# Patient Record
Sex: Male | Born: 1955
Health system: Southern US, Community
[De-identification: ages and names within clinical notes are randomized; demographics above are authoritative.]

## PROBLEM LIST (undated history)

## (undated) DIAGNOSIS — R739 Hyperglycemia, unspecified: Secondary | ICD-10-CM

## (undated) DIAGNOSIS — I2510 Atherosclerotic heart disease of native coronary artery without angina pectoris: Secondary | ICD-10-CM

## (undated) HISTORY — PX: HEMORRHOID SURGERY: SHX153

## (undated) HISTORY — DX: Hyperglycemia, unspecified: R73.9

## (undated) HISTORY — DX: Atherosclerotic heart disease of native coronary artery without angina pectoris: I25.10

---

## 2000-11-07 HISTORY — PX: HEMORRHOID SURGERY: SHX153

## 2000-11-09 ENCOUNTER — Encounter (INDEPENDENT_AMBULATORY_CARE_PROVIDER_SITE_OTHER): Payer: Self-pay | Admitting: *Deleted

## 2000-11-09 ENCOUNTER — Ambulatory Visit (HOSPITAL_BASED_OUTPATIENT_CLINIC_OR_DEPARTMENT_OTHER): Admission: RE | Admit: 2000-11-09 | Discharge: 2000-11-09 | Payer: Self-pay | Admitting: Surgery

## 2003-02-24 ENCOUNTER — Encounter: Payer: Self-pay | Admitting: Gastroenterology

## 2003-02-24 ENCOUNTER — Encounter: Admission: RE | Admit: 2003-02-24 | Discharge: 2003-02-24 | Payer: Self-pay | Admitting: Gastroenterology

## 2003-06-30 ENCOUNTER — Ambulatory Visit (HOSPITAL_COMMUNITY): Admission: RE | Admit: 2003-06-30 | Discharge: 2003-06-30 | Payer: Self-pay | Admitting: Gastroenterology

## 2003-06-30 ENCOUNTER — Encounter (INDEPENDENT_AMBULATORY_CARE_PROVIDER_SITE_OTHER): Payer: Self-pay | Admitting: *Deleted

## 2007-10-30 ENCOUNTER — Ambulatory Visit (HOSPITAL_COMMUNITY): Admission: RE | Admit: 2007-10-30 | Discharge: 2007-10-30 | Payer: Self-pay | Admitting: General Surgery

## 2007-10-30 ENCOUNTER — Encounter (INDEPENDENT_AMBULATORY_CARE_PROVIDER_SITE_OTHER): Payer: Self-pay | Admitting: General Surgery

## 2008-11-07 DIAGNOSIS — I2510 Atherosclerotic heart disease of native coronary artery without angina pectoris: Secondary | ICD-10-CM

## 2008-11-07 HISTORY — DX: Atherosclerotic heart disease of native coronary artery without angina pectoris: I25.10

## 2008-11-07 HISTORY — PX: CORONARY ARTERY BYPASS GRAFT: SHX141

## 2009-10-06 ENCOUNTER — Ambulatory Visit: Payer: Self-pay | Admitting: Thoracic Surgery (Cardiothoracic Vascular Surgery)

## 2009-10-06 ENCOUNTER — Inpatient Hospital Stay (HOSPITAL_COMMUNITY): Admission: AD | Admit: 2009-10-06 | Discharge: 2009-10-11 | Payer: Self-pay | Admitting: Cardiovascular Disease

## 2009-10-07 ENCOUNTER — Encounter: Payer: Self-pay | Admitting: Thoracic Surgery (Cardiothoracic Vascular Surgery)

## 2009-10-23 ENCOUNTER — Ambulatory Visit: Payer: Self-pay | Admitting: Thoracic Surgery (Cardiothoracic Vascular Surgery)

## 2009-11-02 ENCOUNTER — Ambulatory Visit: Payer: Self-pay | Admitting: Thoracic Surgery (Cardiothoracic Vascular Surgery)

## 2009-11-02 ENCOUNTER — Encounter
Admission: RE | Admit: 2009-11-02 | Discharge: 2009-11-02 | Payer: Self-pay | Admitting: Thoracic Surgery (Cardiothoracic Vascular Surgery)

## 2010-01-04 ENCOUNTER — Encounter
Admission: RE | Admit: 2010-01-04 | Discharge: 2010-01-04 | Payer: Self-pay | Admitting: Thoracic Surgery (Cardiothoracic Vascular Surgery)

## 2010-01-04 ENCOUNTER — Ambulatory Visit: Payer: Self-pay | Admitting: Thoracic Surgery (Cardiothoracic Vascular Surgery)

## 2010-07-23 ENCOUNTER — Ambulatory Visit: Payer: Self-pay | Admitting: Cardiovascular Disease

## 2010-11-16 IMAGING — CR DG CHEST 1V PORT
1 series · 1 of 1 positions shown · non-contrast
Comparison: 10/07/2009

CLINICAL DATA: Coronary artery disease.  Postop from coronary
bypass grafting.

PORTABLE CHEST - 1 VIEW

[view not recorded]
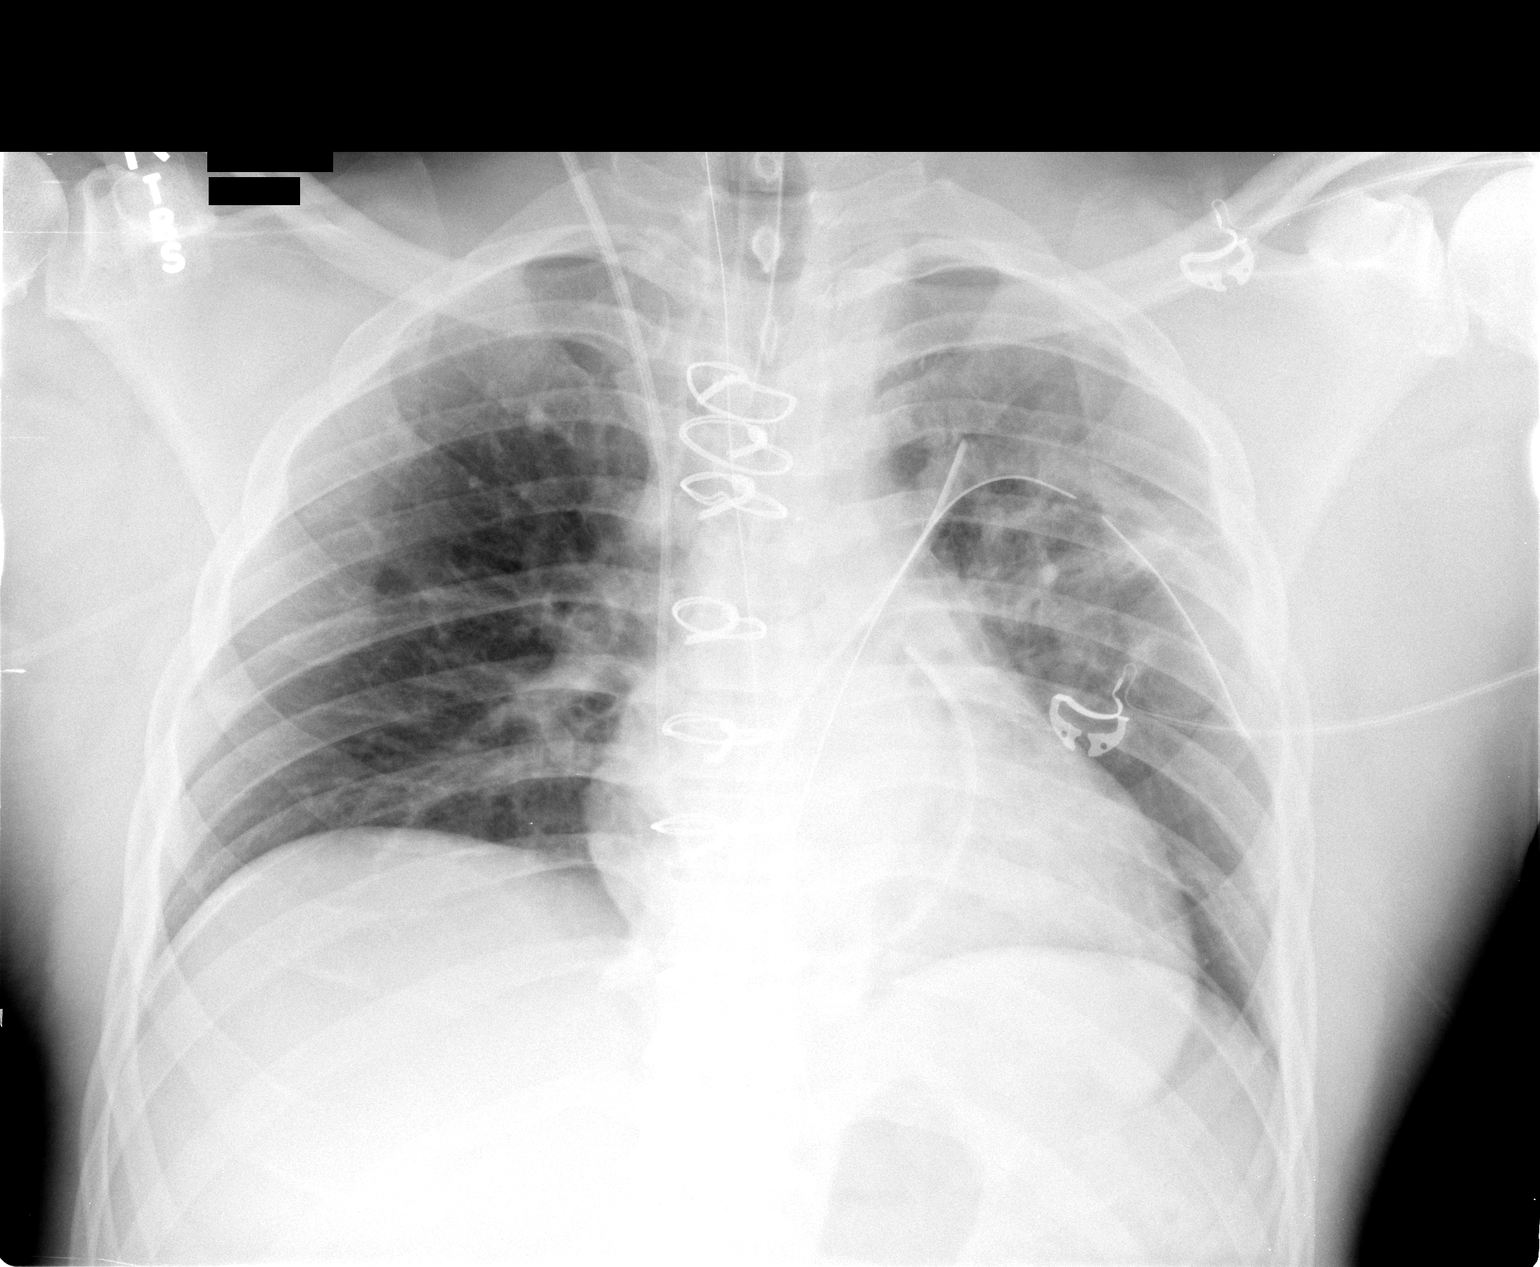

[1 of 1 positions shown; findings below may reference images not displayed]

FINDINGS: Swan-Ganz catheter tip is in the proximal right pulmonary
artery.  Endotracheal tube, nasogastric tube, mediastinal drain and
left chest tube are in appropriate position.

There is no evidence of pneumothorax or pleural effusion.  Mild
atelectasis is seen in the left upper lobe.  Heart size is within
normal limits.  Low lung volumes noted.
IMPRESSION: Postop chest.  Mild left upper lobe atelectasis.  No evidence of
pneumothorax.

## 2010-11-18 IMAGING — CR DG CHEST 2V
2 series · 2 of 2 positions shown · non-contrast
Comparison: Portable chest x-ray of 10/09/2009

CLINICAL DATA: Chest pain

CHEST - 2 VIEW

[w chest pa]
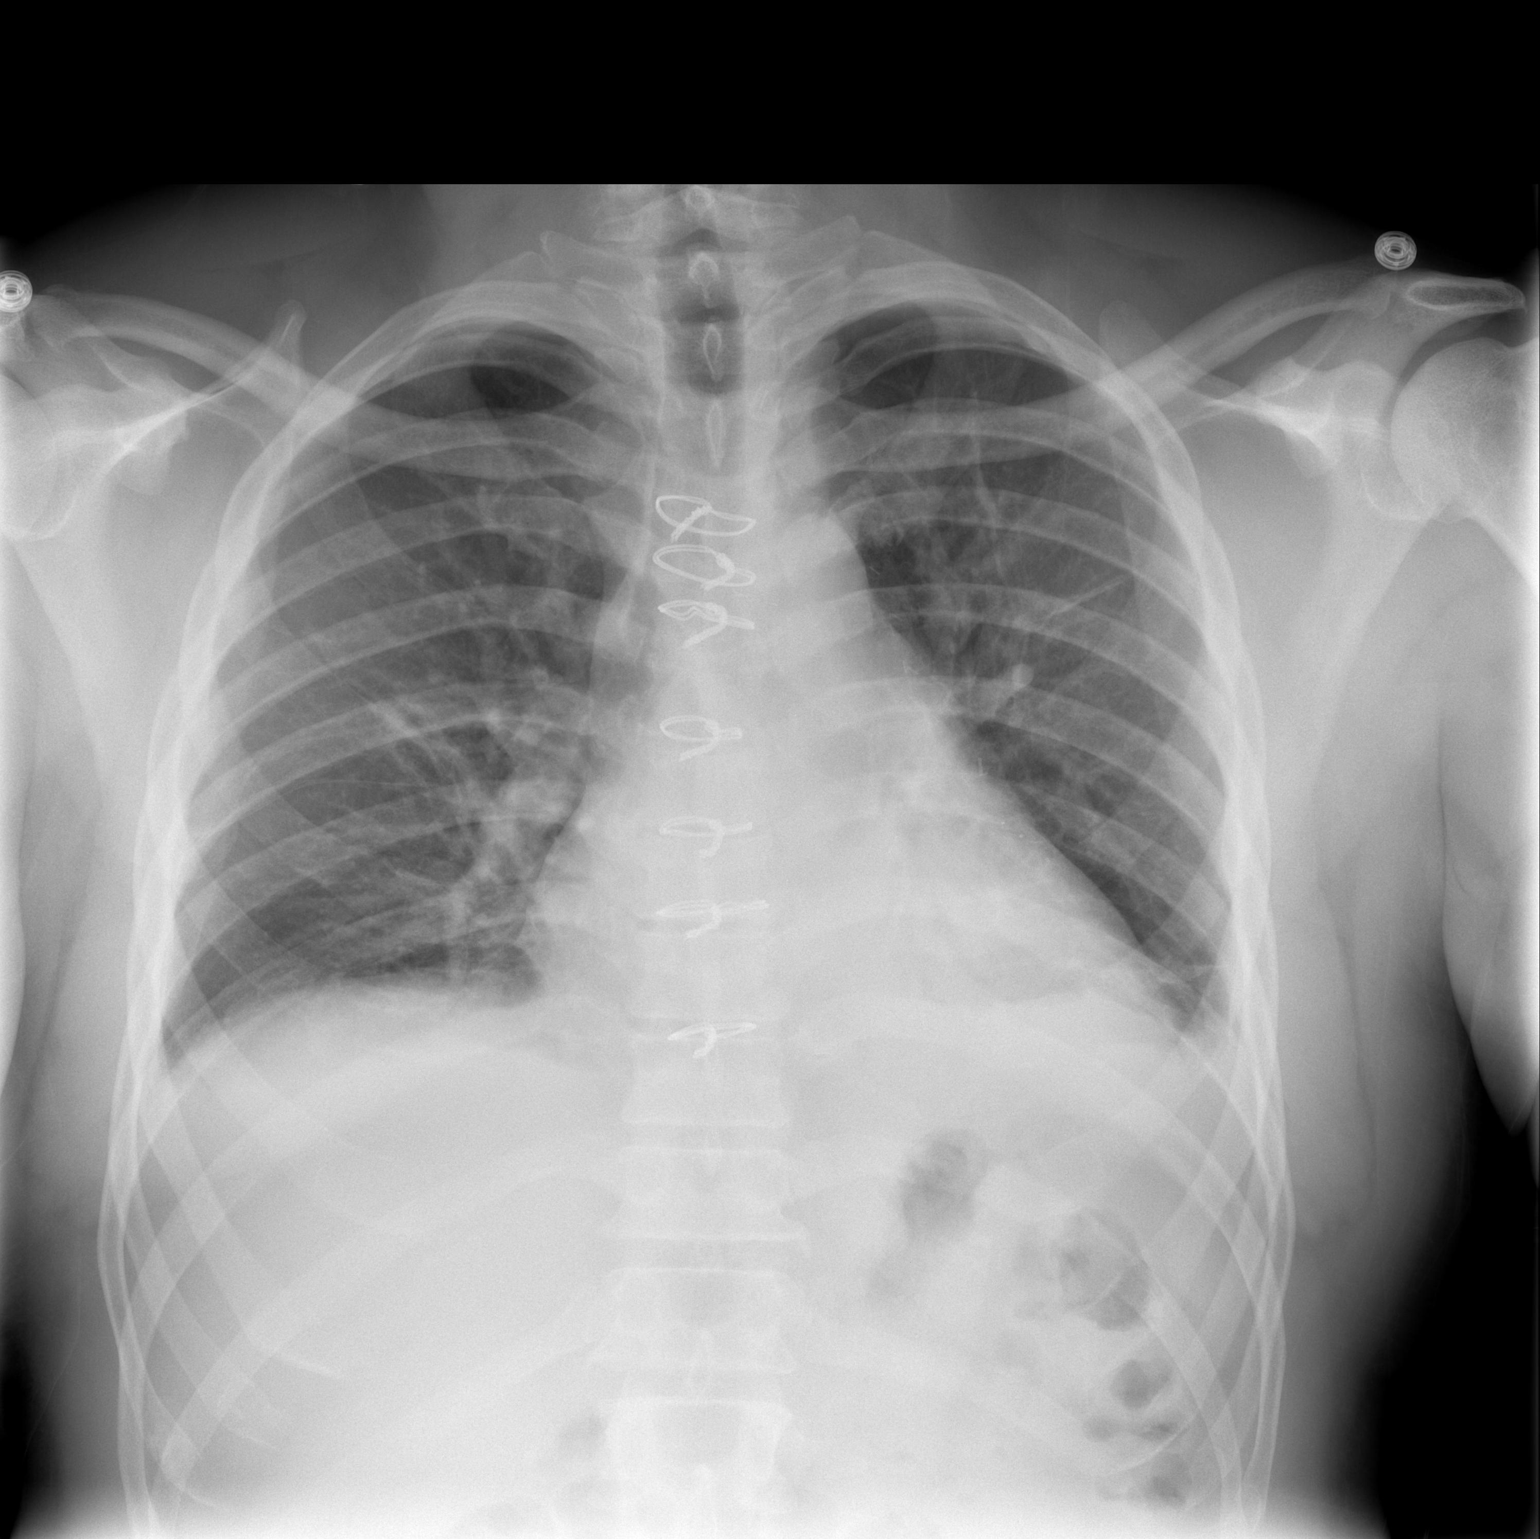

[w chest lat]
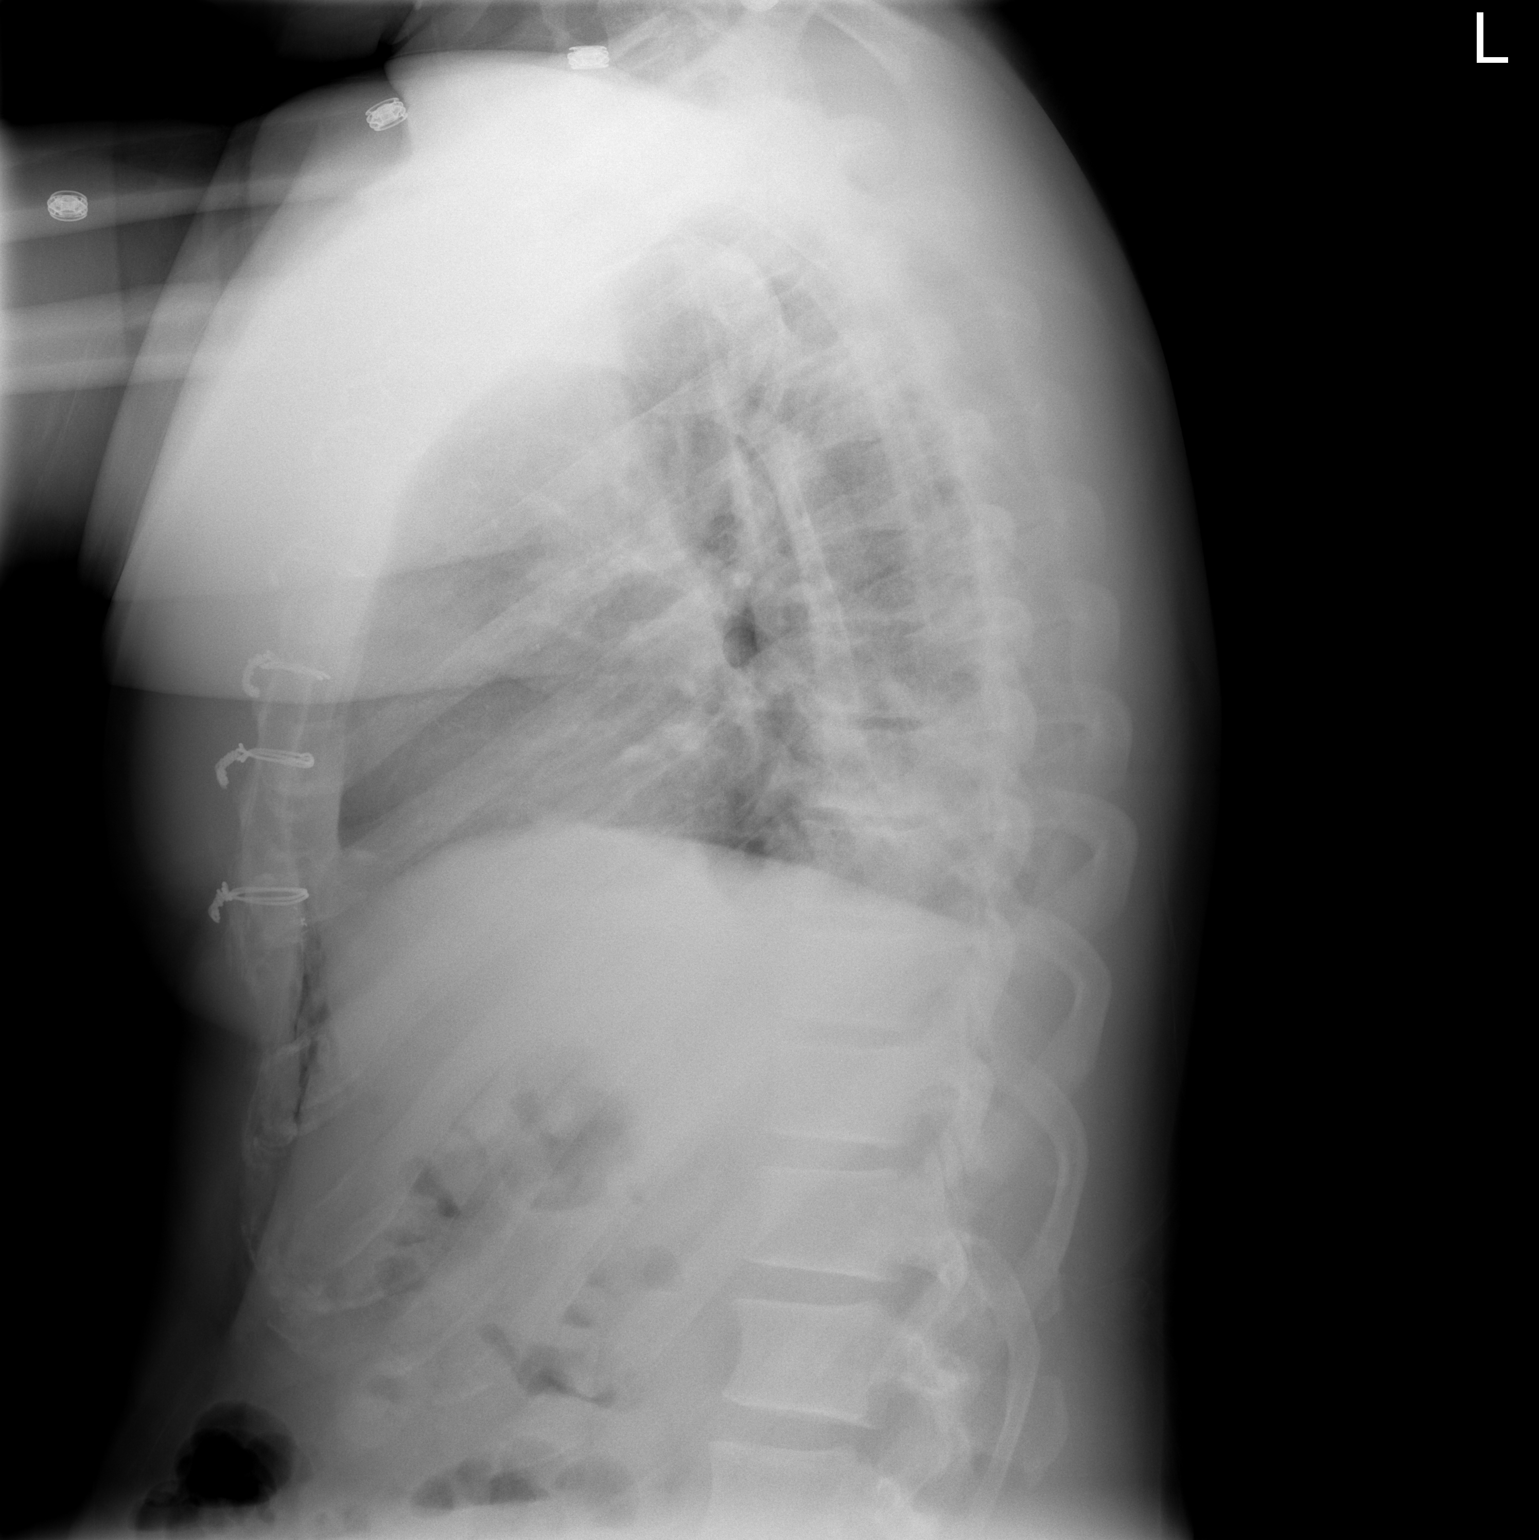

[2 of 2 positions shown; findings below may reference images not displayed]

FINDINGS: The left chest tube and the Swan-Ganz catheter have been
removed.  No pneumothorax is seen.  Basilar opacities persist left
greater than right most consistent with atelectasis and possible
small left effusion.
IMPRESSION: Swan-Ganz catheter and left chest tube removed.  No pneumothorax.

## 2010-12-11 IMAGING — CR DG CHEST 2V
2 series · 2 of 2 positions shown · non-contrast
Comparison: Chest x-ray of 10/10/2009

CLINICAL DATA: Open heart surgery 10/05/2009, follow-up

CHEST - 2 VIEW

[w chest pa]
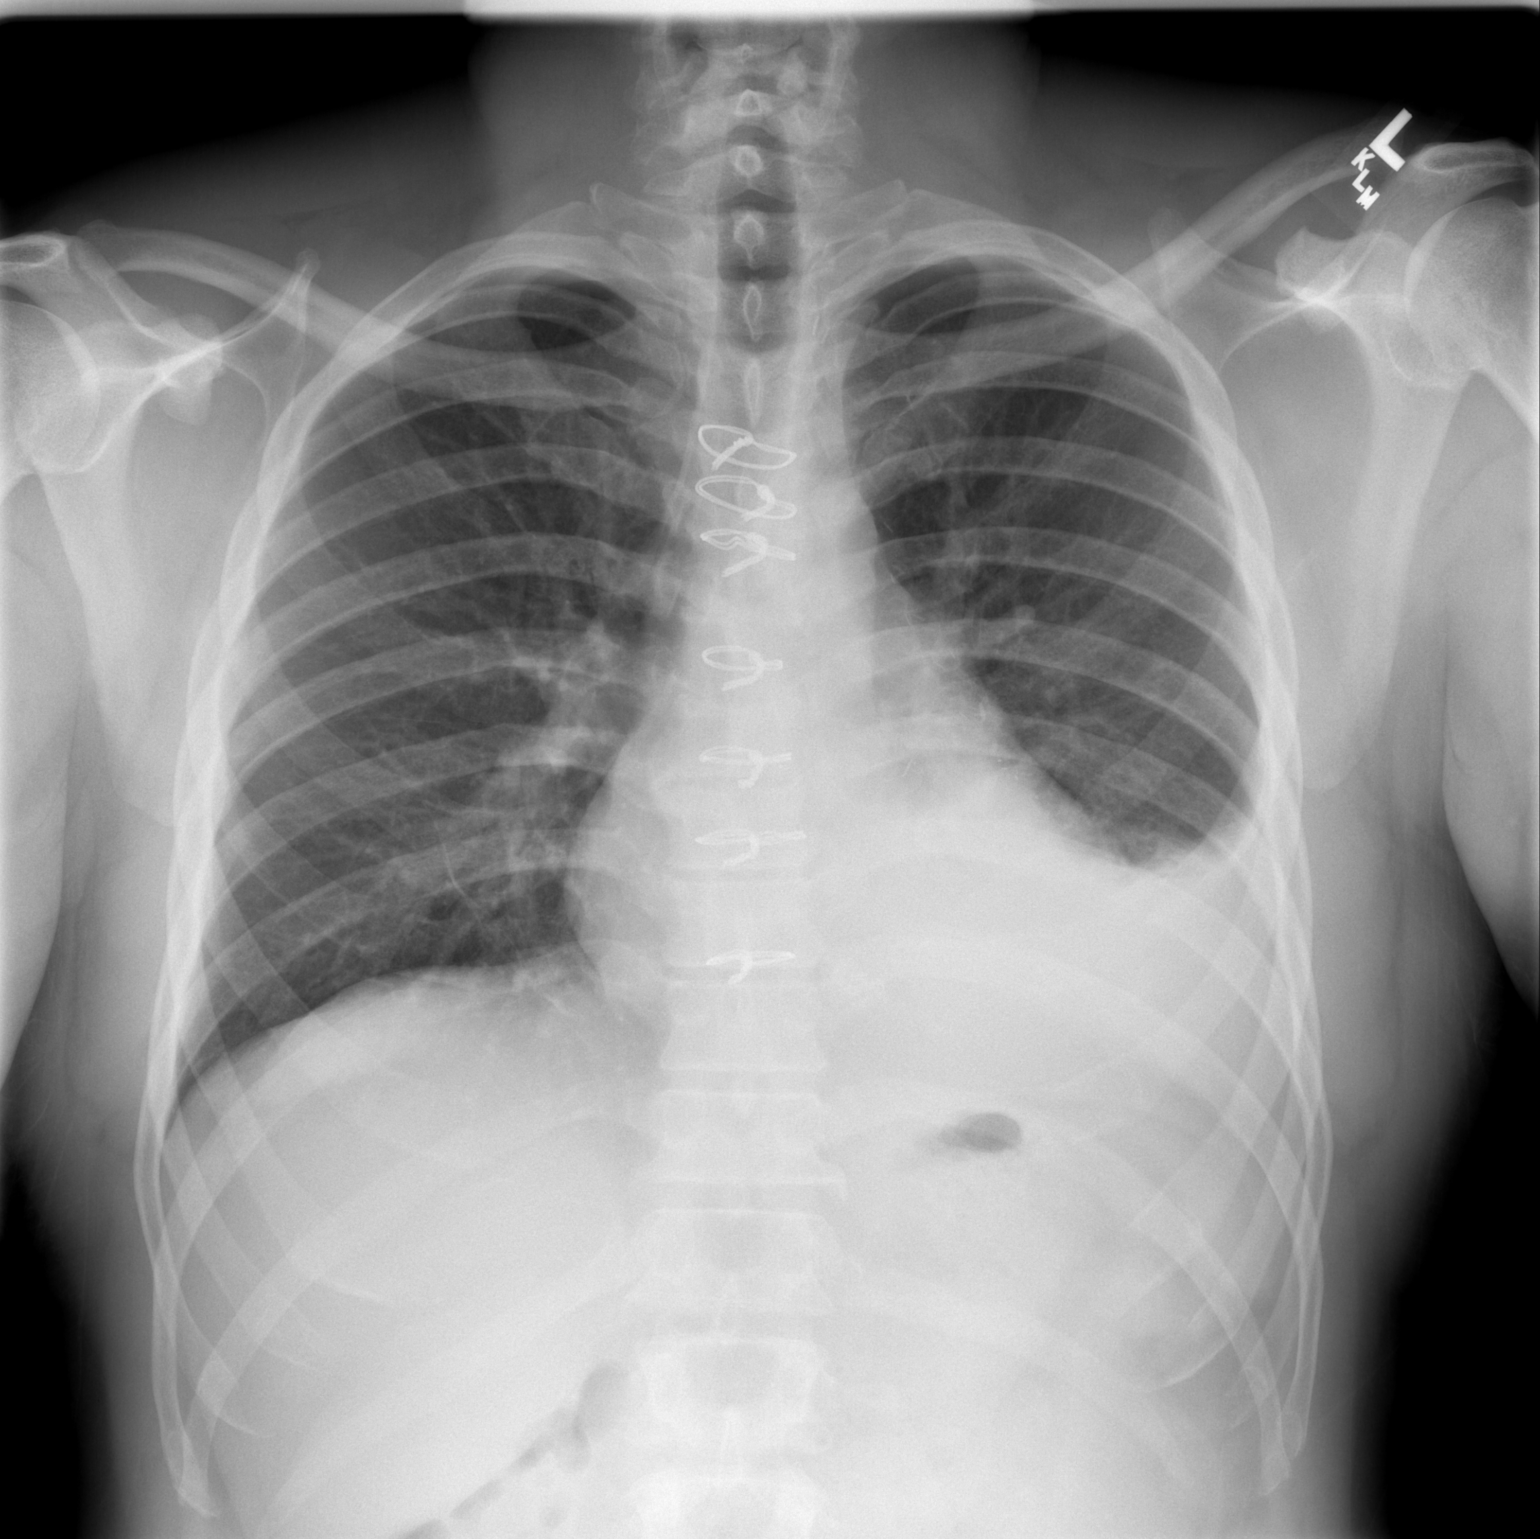

[w chest lat]
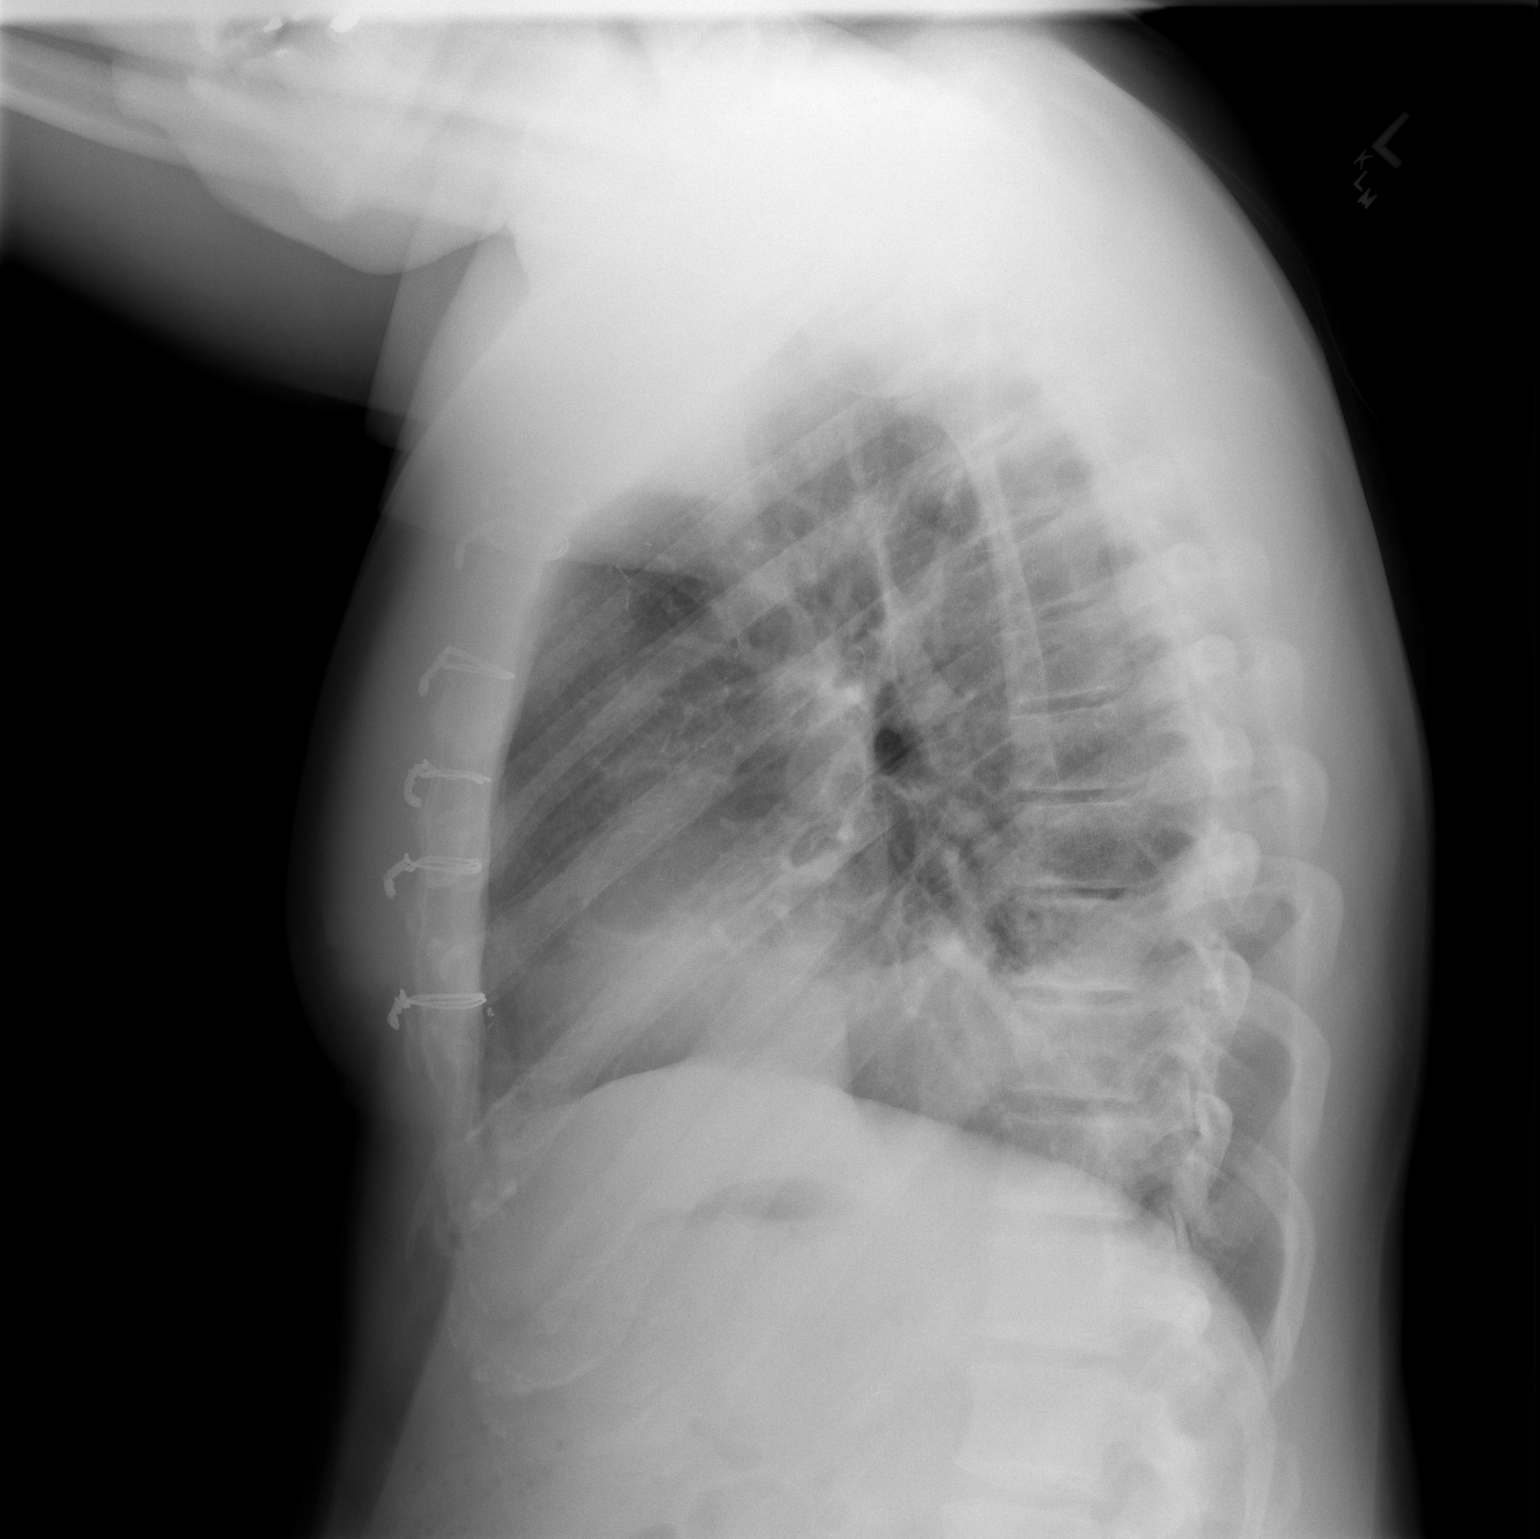

[2 of 2 positions shown; findings below may reference images not displayed]

FINDINGS: There has been an increase in opacity at the left lung
base in the interval.  This most likely is due to increasing
effusion as well as atelectasis.  Pneumonia involving the left
lower lobe cannot be excluded.  The right lung is clear.  Heart
size is stable.  Median sternotomy sutures are noted.
IMPRESSION: Increase in opacity at the left lung base consistent with increase
in left effusion, atelectasis. Left lower lobe pneumonia cannot be
excluded.

## 2010-12-28 ENCOUNTER — Encounter: Payer: Self-pay | Admitting: Cardiovascular Disease

## 2011-01-06 ENCOUNTER — Encounter: Payer: Self-pay | Admitting: Cardiovascular Disease

## 2011-01-06 DIAGNOSIS — I25708 Atherosclerosis of coronary artery bypass graft(s), unspecified, with other forms of angina pectoris: Secondary | ICD-10-CM | POA: Insufficient documentation

## 2011-01-06 DIAGNOSIS — E1169 Type 2 diabetes mellitus with other specified complication: Secondary | ICD-10-CM | POA: Insufficient documentation

## 2011-01-06 DIAGNOSIS — I2510 Atherosclerotic heart disease of native coronary artery without angina pectoris: Secondary | ICD-10-CM | POA: Insufficient documentation

## 2011-02-03 ENCOUNTER — Ambulatory Visit: Payer: Self-pay | Admitting: Cardiovascular Disease

## 2011-02-08 LAB — BASIC METABOLIC PANEL
BUN: 9 mg/dL (ref 6–23)
CO2: 25 mEq/L (ref 19–32)
CO2: 28 mEq/L (ref 19–32)
Calcium: 8.3 mg/dL — ABNORMAL LOW (ref 8.4–10.5)
Chloride: 107 mEq/L (ref 96–112)
Creatinine, Ser: 1.08 mg/dL (ref 0.4–1.5)
GFR calc Af Amer: >60 mL/min (ref >60)
GFR calc non Af Amer: >60 mL/min (ref >60)
Glucose, Bld: 116 mg/dL — ABNORMAL HIGH (ref 70–99)
Glucose, Bld: 151 mg/dL — ABNORMAL HIGH (ref 70–99)
Potassium: 3.9 mEq/L (ref 3.5–5.1)
Potassium: 4 mEq/L (ref 3.5–5.1)
Sodium: 138 mEq/L (ref 135–145)
Sodium: 141 mEq/L (ref 135–145)

## 2011-02-08 LAB — POCT I-STAT 4, (NA,K, GLUC, HGB,HCT)
Glucose, Bld: 127 mg/dL — ABNORMAL HIGH (ref 70–99)
HCT: 42 % (ref 39.0–52.0)
Hemoglobin: 11.9 g/dL — ABNORMAL LOW (ref 13.0–17.0)
Hemoglobin: 13.6 g/dL (ref 13.0–17.0)
Hemoglobin: 14.3 g/dL (ref 13.0–17.0)
Potassium: 3.6 mEq/L (ref 3.5–5.1)
Potassium: 3.9 mEq/L (ref 3.5–5.1)
Potassium: 3.9 mEq/L (ref 3.5–5.1)
Sodium: 141 mEq/L (ref 135–145)
Sodium: 143 mEq/L (ref 135–145)

## 2011-02-08 LAB — COMPREHENSIVE METABOLIC PANEL
ALT: 38 U/L (ref 0–53)
AST: 34 U/L (ref 0–37)
Albumin: 3.8 g/dL (ref 3.5–5.2)
Alkaline Phosphatase: 37 U/L — ABNORMAL LOW (ref 39–117)
CO2: 25 mEq/L (ref 19–32)
Chloride: 107 mEq/L (ref 96–112)
GFR calc non Af Amer: >60 mL/min (ref >60)
Potassium: 3.7 mEq/L (ref 3.5–5.1)
Total Bilirubin: 0.8 mg/dL (ref 0.3–1.2)

## 2011-02-08 LAB — CBC
HCT: 38.5 % — ABNORMAL LOW (ref 39.0–52.0)
HCT: 41.1 % (ref 39.0–52.0)
Hemoglobin: 11.8 g/dL — ABNORMAL LOW (ref 13.0–17.0)
Hemoglobin: 12.3 g/dL — ABNORMAL LOW (ref 13.0–17.0)
Hemoglobin: 13.4 g/dL (ref 13.0–17.0)
Hemoglobin: 14.2 g/dL (ref 13.0–17.0)
Hemoglobin: 14.3 g/dL (ref 13.0–17.0)
MCHC: 34.1 g/dL (ref 30.0–36.0)
MCHC: 34.2 g/dL (ref 30.0–36.0)
MCHC: 34.4 g/dL (ref 30.0–36.0)
MCHC: 34.6 g/dL (ref 30.0–36.0)
MCHC: 34.7 g/dL (ref 30.0–36.0)
MCV: 88.3 fL (ref 78.0–100.0)
MCV: 88.4 fL (ref 78.0–100.0)
MCV: 88.7 fL (ref 78.0–100.0)
MCV: 89 fL (ref 78.0–100.0)
Platelets: 145 10*3/uL — ABNORMAL LOW (ref 150–400)
Platelets: 178 10*3/uL (ref 150–400)
RBC: 4.02 MIL/uL — ABNORMAL LOW (ref 4.22–5.81)
RBC: 4.34 MIL/uL (ref 4.22–5.81)
RBC: 4.36 MIL/uL (ref 4.22–5.81)
RBC: 4.44 MIL/uL (ref 4.22–5.81)
RDW: 13 % (ref 11.5–15.5)
RDW: 13.1 % (ref 11.5–15.5)
RDW: 13.2 % (ref 11.5–15.5)
RDW: 13.7 % (ref 11.5–15.5)
WBC: 10 10*3/uL (ref 4.0–10.5)
WBC: 7.8 10*3/uL (ref 4.0–10.5)
WBC: 9.9 10*3/uL (ref 4.0–10.5)

## 2011-02-08 LAB — HEPARIN LEVEL (UNFRACTIONATED)

## 2011-02-08 LAB — MAGNESIUM
Magnesium: 2 mg/dL (ref 1.5–2.5)
Magnesium: 2.3 mg/dL (ref 1.5–2.5)

## 2011-02-08 LAB — GLUCOSE, CAPILLARY
Glucose-Capillary: 101 mg/dL — ABNORMAL HIGH (ref 70–99)
Glucose-Capillary: 105 mg/dL — ABNORMAL HIGH (ref 70–99)
Glucose-Capillary: 121 mg/dL — ABNORMAL HIGH (ref 70–99)
Glucose-Capillary: 127 mg/dL — ABNORMAL HIGH (ref 70–99)
Glucose-Capillary: 132 mg/dL — ABNORMAL HIGH (ref 70–99)
Glucose-Capillary: 160 mg/dL — ABNORMAL HIGH (ref 70–99)
Glucose-Capillary: 179 mg/dL — ABNORMAL HIGH (ref 70–99)
Glucose-Capillary: 215 mg/dL — ABNORMAL HIGH (ref 70–99)

## 2011-02-08 LAB — LIPID PANEL
HDL: 43 mg/dL (ref >39)
Total CHOL/HDL Ratio: 4.2 RATIO
Triglycerides: 158 mg/dL — ABNORMAL HIGH (ref <150)
VLDL: 32 mg/dL (ref 0–40)

## 2011-02-08 LAB — CARDIAC PANEL(CRET KIN+CKTOT+MB+TROPI)
CK, MB: 5.2 ng/mL — ABNORMAL HIGH (ref 0.3–4.0)
Relative Index: 0.5 (ref 0.0–2.5)
Total CK: 1057 U/L — ABNORMAL HIGH (ref 7–232)
Troponin I: 0.29 ng/mL — ABNORMAL HIGH (ref 0.00–0.06)

## 2011-02-08 LAB — URINALYSIS, MICROSCOPIC ONLY
Bilirubin Urine: NEGATIVE
Hgb urine dipstick: NEGATIVE
Protein, ur: NEGATIVE mg/dL
Urobilinogen, UA: 0.2 mg/dL (ref 0.0–1.0)

## 2011-02-08 LAB — POCT I-STAT 3, ART BLOOD GAS (G3+)
Acid-base deficit: 1 mmol/L (ref 0.0–2.0)
Acid-base deficit: 2 mmol/L (ref 0.0–2.0)
Bicarbonate: 23.5 mEq/L (ref 20.0–24.0)
Bicarbonate: 24.7 mEq/L — ABNORMAL HIGH (ref 20.0–24.0)
O2 Saturation: 98 %
O2 Saturation: 98 %
Patient temperature: 37.6
Patient temperature: 93.5
TCO2: 25 mmol/L (ref 0–100)
TCO2: 26 mmol/L (ref 0–100)
pCO2 arterial: 37.7 mmHg (ref 35.0–45.0)
pCO2 arterial: 43.7 mmHg (ref 35.0–45.0)
pH, Arterial: 7.341 — ABNORMAL LOW (ref 7.350–7.450)
pH, Arterial: 7.411 (ref 7.350–7.450)
pO2, Arterial: 102 mmHg — ABNORMAL HIGH (ref 80.0–100.0)
pO2, Arterial: 115 mmHg — ABNORMAL HIGH (ref 80.0–100.0)

## 2011-02-08 LAB — BLOOD GAS, ARTERIAL
FIO2: 0.21 %
O2 Saturation: 96.6 %
pCO2 arterial: 43.4 mmHg (ref 35.0–45.0)
pH, Arterial: 7.385 (ref 7.350–7.450)
pO2, Arterial: 84.8 mmHg (ref 80.0–100.0)

## 2011-02-08 LAB — POCT I-STAT, CHEM 8
BUN: 8 mg/dL (ref 6–23)
Calcium, Ion: 1.27 mmol/L (ref 1.12–1.32)
Chloride: 106 mEq/L (ref 96–112)
Creatinine, Ser: 0.9 mg/dL (ref 0.4–1.5)
Glucose, Bld: 156 mg/dL — ABNORMAL HIGH (ref 70–99)
HCT: 40 % (ref 39.0–52.0)
Hemoglobin: 13.6 g/dL (ref 13.0–17.0)
Potassium: 4.3 mEq/L (ref 3.5–5.1)
Sodium: 140 mEq/L (ref 135–145)
TCO2: 23 mmol/L (ref 0–100)

## 2011-02-08 LAB — MRSA PCR SCREENING: MRSA by PCR: NEGATIVE

## 2011-02-08 LAB — PROTIME-INR
INR: 1.09 (ref 0.00–1.49)
Prothrombin Time: 13.6 seconds (ref 11.6–15.2)

## 2011-02-08 LAB — ABO/RH: ABO/RH(D): O POS

## 2011-02-08 LAB — PREPARE PLATELETS

## 2011-02-08 LAB — CREATININE, SERUM
Creatinine, Ser: 1.13 mg/dL (ref 0.4–1.5)
GFR calc Af Amer: >60 mL/min (ref >60)

## 2011-02-08 LAB — TYPE AND SCREEN: ABO/RH(D): O POS

## 2011-02-23 ENCOUNTER — Encounter: Payer: Self-pay | Admitting: Cardiovascular Disease

## 2011-03-02 ENCOUNTER — Other Ambulatory Visit: Payer: Self-pay | Admitting: *Deleted

## 2011-03-02 DIAGNOSIS — E7800 Pure hypercholesterolemia, unspecified: Secondary | ICD-10-CM

## 2011-03-14 ENCOUNTER — Encounter: Payer: Self-pay | Admitting: Cardiovascular Disease

## 2011-03-14 ENCOUNTER — Ambulatory Visit (INDEPENDENT_AMBULATORY_CARE_PROVIDER_SITE_OTHER): Payer: 59 | Admitting: Cardiovascular Disease

## 2011-03-14 ENCOUNTER — Other Ambulatory Visit (INDEPENDENT_AMBULATORY_CARE_PROVIDER_SITE_OTHER): Payer: 59 | Admitting: *Deleted

## 2011-03-14 VITALS — BP 122/82 | HR 72 | Ht 70.0 in | Wt 216.0 lb

## 2011-03-14 DIAGNOSIS — E7800 Pure hypercholesterolemia, unspecified: Secondary | ICD-10-CM

## 2011-03-14 DIAGNOSIS — I2510 Atherosclerotic heart disease of native coronary artery without angina pectoris: Secondary | ICD-10-CM

## 2011-03-14 LAB — HEPATIC FUNCTION PANEL
ALT: 79 U/L — ABNORMAL HIGH (ref 0–53)
AST: 51 U/L — ABNORMAL HIGH (ref 0–37)
Bilirubin, Direct: 0.1 mg/dL (ref 0.0–0.3)
Total Bilirubin: 0.7 mg/dL (ref 0.3–1.2)
Total Protein: 6.8 g/dL (ref 6.0–8.3)

## 2011-03-14 LAB — LIPID PANEL
Cholesterol: 129 mg/dL (ref 0–200)
VLDL: 18.8 mg/dL (ref 0.0–40.0)

## 2011-03-14 LAB — BASIC METABOLIC PANEL
BUN: 12 mg/dL (ref 6–23)
Calcium: 9.3 mg/dL (ref 8.4–10.5)
GFR: 92.15 mL/min (ref >60.00)
Glucose, Bld: 112 mg/dL — ABNORMAL HIGH (ref 70–99)
Potassium: 3.9 mEq/L (ref 3.5–5.1)
Sodium: 139 mEq/L (ref 135–145)

## 2011-03-14 NOTE — Progress Notes (Signed)
Darin Pitts Date of Birth  Mar 08, 1956 [facility] [address]     [address] [place], [place]  [address] [phone]  Fax  [phone]  History of Present Illness:  This is a middle-aged gentleman with a history of coronary artery disease-status post coronary artery bypass grafting. He also has a history of hyper-glycemia. He has not had any episodes of chest pain or shortness of breath.  Current Outpatient Prescriptions on File Prior to Visit  Medication Sig Dispense Refill  . aspirin 325 MG tablet Take 325 mg by mouth daily.        . metoprolol (LOPRESSOR) 25 MG tablet Take 25 mg by mouth 2 (two) times daily.        . Multiple Vitamin (MULTIVITAMIN) tablet Take 1 tablet by mouth daily.        . nitroGLYCERIN (NITROSTAT) 0.4 MG SL tablet Place 0.4 mg under the tongue every 5 (five) minutes as needed.        . OXYCODONE HCL PO Take by mouth as needed.        . simvastatin (ZOCOR) 20 MG tablet Take 20 mg by mouth at bedtime.          No Known Allergies  Past Medical History  Diagnosis Date  . CAD (coronary artery disease)     CABG  DR. Buckle Moras  . Hyperglycemia     Past Surgical History  Procedure Date  . Hemorrhoid surgery     History  Smoking status  . Never Smoker   Smokeless tobacco  . Not on file    History  Alcohol Use No    Family History  Problem Relation Age of Onset  . Hypertension Father   . Diabetes Father   . Hypertension Mother   . Stroke Mother   . Diabetes Mother   . Alzheimer's disease Mother   . Hypertension Brother   . Diabetes Brother     Reviw of Systems:  Reviewed in the HPI.  All other systems are negative.  Physical Exam: BP 122/82  Pulse 72  Ht 5\' 10"  (1.778 m)  Wt 216 lb (97.977 kg)  BMI 30.99 kg/m2 The patient is alert and oriented x 3.  The mood and affect are normal.  The skin is warm and dry.  Color is normal.  The HEENT exam reveals that the sclera are nonicteric.  The  mucous membranes are moist.  The carotids are 2+ without bruits.  There is no thyromegaly.  There is no JVD.  The lungs are clear.  The chest wall is non tender.  The heart exam reveals a regular rate with a normal S1 and S2.  There are no murmurs, gallops, or rubs.  The PMI is not displaced.   Abdominal exam reveals good bowel sounds.  There is no guarding or rebound.  There is no hepatosplenomegaly or tenderness.  There are no masses.  Exam of the legs reveal no clubbing, cyanosis, or edema.  The legs are without rashes.  The distal pulses are intact.  Cranial nerves II - XII are intact.  Motor and sensory functions are intact.  The gait is normal.  Assessment / Plan:

## 2011-03-14 NOTE — Assessment & Plan Note (Signed)
Loose doing very well from a cardiac standpoint. We'll continue with his same medications. I've encouraged him to change to atorvastatin as soon as it becomes as Inexpensive as simvastatin.  I've asked him to continue with a good diet and exercise program. I'll see him again in one year for a fasting lipid profile, basic metabolic profile, and hepatic profile.

## 2011-03-15 ENCOUNTER — Telehealth: Payer: Self-pay | Admitting: *Deleted

## 2011-03-15 NOTE — Telephone Encounter (Signed)
Message copied by Mahalia Longest on Tue Mar 15, 2011 12:11 PM ------      Message from: Kristeen Miss      Created: Tue Mar 15, 2011  8:49 AM       LFTs are slightly high.  Please recheck lipids and HFP in 3 months.  He may need to change to Lipator 20 daily.            Copy of labs to Dorothyann Peng, MD

## 2011-03-15 NOTE — Telephone Encounter (Signed)
RESULTS OF LABS GIVEN,PT INFORMED TO COME IN FASTING IN , ORDER PLACED AND I WILL PLACE ON CALL SCHEDULE. Alfonso Ramus RN

## 2011-03-22 NOTE — Assessment & Plan Note (Signed)
OFFICE VISIT   LARY, ECKARDT  DOB:  Apr 03, 1956                                        January 04, 2010  CHART #:  [id number]   HISTORY OF PRESENT ILLNESS:  The patient returns for followup status  post off-pump coronary artery bypass grafting x1 on September 08, 2009.  He has been doing remarkably well and he returns to our office today to  get clear to return to work.  He has had no problems whatsoever and he  completed his cardiac rehab program.  He has no soreness in his chest at  all, and he states that he has actually even gone back to lifting  weights.  His exercise tolerance is quite good.  He has no shortness of  breath.  He has no chest pain.  He has done remarkably well.  The  remainder of his review of systems is unremarkable.  The remainder of  his past medical history is unchanged.   PHYSICAL EXAMINATION:  Notable for well-appearing male with blood  pressure 129/82, pulse 90, oxygen saturation 98% on room air.  Examination of the chest reveals a median sternotomy scar that has  healed nicely.  The sternum is stable on palpation.  Breath sounds are  clear to auscultation and symmetrical bilaterally.  No wheezes, rales,  or rhonchi are noted.  Cardiovascular exam demonstrates regular rate and  rhythm.  The abdomen is soft, nontender.  The extremities are warm and  well perfused.   IMPRESSION:  The patient is doing quite well now approximately 3 months  status post off-pump coronary artery bypass grafting.  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   PLAN:  In the future, the patient will call and return to see [place] as  needed.   Salvatore Decent. Bitting Moras, M.D.  Electronically Signed   CHO/MEDQ  D:  01/04/2010  T:  01/04/2010  Job:  [id number]   cc:   Vesta Mixer, M.D.

## 2011-03-22 NOTE — Op Note (Signed)
NAME:  Darin Pitts, Darin Pitts             ACCOUNT NO.:  [id number]   MEDICAL RECORD NO.:  [id number]          PATIENT TYPE:  AMB   LOCATION:  DAY                          FACILITY:  [facility]   PHYSICIAN:  Ollen Gross. Vernell Morgans, M.D. DATE OF BIRTH:  [birth year]   DATE OF PROCEDURE:  10/30/2007  DATE OF DISCHARGE:                               OPERATIVE REPORT   PREOPERATIVE DIAGNOSIS:  Internal hemorrhoids.   POSTOPERATIVE DIAGNOSIS:  Internal hemorrhoids.   PROCEDURE:  PPH hemorrhoidectomy.   SURGEON:  Ollen Gross. Vernell Morgans, M.D.   ANESTHESIA:  General endotracheal.   PROCEDURE:  After informed consent was obtained, the patient was brought  to the operating room, left in supine position on a stretcher.  After  adequate induction of general anesthesia, the patient was moved into a  prone position on the operating room table.  All pressure points were  padded.  The patient was placed in a sort of jackknife type position.  His buttocks were retracted laterally with tape.  His perirectal area  was prepped with Betadine and draped in usual sterile manner.  The area  around the rectum was infiltrated with 4% Marcaine and 1 mL Wydase and  the tissue was massaged gently for several minutes.  Two fingers could  be inserted easily into the rectum.  A large deep Fansler retractor was  then placed in the rectum and the rectum was examined circumferentially.  The patient had a couple of very large internal hemorrhoids.  Next a 2-0  Prolene pursestring stitch was placed in the mucosa circumferentially  approximately 4 cm deep to the dentate line, taking care to make sure  each stitch started where the previous stitch ended and not gathering up  any muscle.  Once this pursestring stitch was in place, the Fansler  retractor was removed and a clear plastic retractor and white dilator  was then placed in the rectum and the tails of the Prolene were brought  through the opening in these two retractors.  The white  dilator was then  removed holding the clear plastic retractor in place.  A PPH stapling  device was then placed in the rectum and the anvil was felt to fall deep  to the pursestring stitch.  The pursestring stitch was then cinched down  and tied.  The tails of the pursestring stitch were brought through the  lateral openings in the stapling device.  Air knot was then tied for  retracting on the Prolenes with gentle retraction on the tails of the  Prolene.  The stapling device was then closed and the stapling device  was advanced into the rectum.  Once the stapling device was completely  closed, the 4 cm mark was just deep to the region of the clear plastic  retractor which also indicated that it was in good position.  The  stapling device was then fired.  A minute was allowed to pass.  The  stapling device was then opened a half turn and removed from the rectum.  The donut of tissue appeared to be complete with good hemorrhoid tissue.  It was  sent to pathology for further evaluation.  Next a large bullet  retractor was placed in the rectum.  The staple line was examined,  several bleeding points were controlled with figure-of-eight 4-0 Vicryl  stitches until the area was completely hemostatic.  The staple line was  watched for several minutes to make sure there was no further bleeding.  A small piece of  Gelfoam coated with dibucaine ointment was then placed in the rectum and  some dibucaine ointment was placed on the outside of the rectum.  Sterile dressings were applied.  The patient tolerated procedure well.  At end of the case all needle, sponge, instrument counts correct.  The  patient was awakened and taken recovery in stable condition.      Ollen Gross. Vernell Morgans, M.D.  Electronically Signed     PST/MEDQ  D:  10/30/2007  T:  10/30/2007  Job:  [id number]

## 2011-03-22 NOTE — Assessment & Plan Note (Signed)
OFFICE VISIT   CLEATIS, FANDRICH  DOB:  [birth year]                                        November 02, 2009  CHART #:  [id number]   HISTORY OF PRESENT ILLNESS:  The patient returns to the office today for  routine follow up status post off-pump coronary artery bypass grafting  x1 on October 08, 2009.  His postoperative recovery has been  uncomplicated.  Following hospital discharge, he has continued to do  quite well.  He has been seen in follow up by Dr. Elease Hashimoto, and he returns  for routine follow up to our office today.  Overall, he has no problems.  He has mild residual soreness in his chest and he is no longer taking  any pain medication.  He has not had any shortness of breath at all.  His appetite is good.  He is sleeping well at night.  He has no  complaints and overall feels good.   CURRENT MEDICATIONS:  Aspirin, metoprolol, simvastatin.   PHYSICAL EXAMINATION:  Notable for well-appearing male with blood  pressure 121/70, pulse 83 and regular, oxygen saturation 98% on room  air.  Examination of the chest is notable for median sternotomy incision  that is healing nicely.  The sternum is stable on palpation.  Breath  sounds are clear to auscultation and symmetrical bilaterally.  No  wheezes or rhonchi are noted.  Cardiovascular exam includes regular rate  and rhythm.  No murmurs, rubs, or gallops are appreciated.  The abdomen  is soft, nontender.  The extremities are warm and well perfused.  There  is no lower extremity edema.   DIAGNOSTIC TESTS:  Chest x-ray performed today at the [facility] is reviewed.  This demonstrates clear lung fields bilaterally  with exception of a small left pleural effusion and associated left  lower lobe atelectasis.  All the sternal wires appear intact.  No other  abnormalities are noted.   IMPRESSION:  The patient is doing very well.   PLAN:  I have encouraged the patient to continue to gradually  increase  his physical activity as tolerated with his only limitation at this  point remaining that he refrain from heavy lifting or strenuous use his  arms or shoulders for at least another 2 months.  I have encouraged him  to get involved in the cardiac rehab program.  I think he can go ahead  and start driving an automobile.  All of his questions have been  addressed.  We have not made any changes in his current medications.  We  have discussed his long-term dietary concerns particularly with regards  to the fact that he probably does have at least borderline type 2  diabetes mellitus based upon mild hyperglycemia and mildly elevated  hemoglobin A1c that was noted at the time of his recent hospitalization  and surgery.  All of his questions have been addressed.  We will plan to  see him back in 2 months' time for repeat chest x-ray to make sure that  his pleural effusion has resolved.   Salvatore Decent. Metallo Moras, M.D.  Electronically Signed   CHO/MEDQ  D:  11/02/2009  T:  11/02/2009  Job:  [id number]   cc:   Vesta Mixer, M.D.

## 2011-03-25 NOTE — Op Note (Signed)
[place]  Patient:    Darin Pitts, Darin Pitts                      MRN: [id number] Proc. Date: 11/09/00 Adm. Date:  [id number] Attending:  Bonnetta Barry                           Operative Report  PREOPERATIVE DIAGNOSIS:  Chronic prolapsed hemorrhoids.  POSTOPERATIVE DIAGNOSIS:  Chronic prolapsed hemorrhoids.  OPERATION PERFORMED:  Open hemorrhoidectomy, left lateral column and right posterior column.  SURGEON:  Velora Heckler, M.D.  ANESTHESIA:  General.  ESTIMATED BLOOD LOSS:  Minimal.  PREPARATION:  Betadine.  INDICATIONS FOR PROCEDURE:  The patient is a 55 year old black male who presents with chronic prolapsed hemorrhoids.  He has been followed in my practice for over one year.  He has failed local symptomatic measures.  He now comes to surgery for open hemorrhoidectomy.  DESCRIPTION OF PROCEDURE:  The procedure was done in OR #4 at [facility].  The patient was brought to the operating room and placed in supine position on the operating room table.  Following administration of general anesthesia, the patient was placed in lithotomy prepped and draped in the usual strict aseptic fashion.  Digital rectal exam was performed. Anoscopy was performed.  The most prominent hemorrhoidal columns with friability and easy bleeding are the left lateral column and right posterior column.  Dissection was begun on the left side.  The skin and mucosa were anesthetized with local anesthetic with epinephrine.  A 3-0 chromic gut suture was placed at the apex of the hemorrhoidal column.  A V-shaped incision was then made on the anoderm.  Dissection was carried into the subcutaneous plane taking care to avoid injury to the underlying sphincter mechanism.  A diamond shaped incision was then made up to the apex of the hemorrhoidal column and the hemorrhoidal column excised completely.  The mucosal edges and anoderm were reapproximated  with running 3-0 chromic gut suture.  Good hemostasis was noted.  Anoscope was removed.  Digital rectal exam was again performed. Anoscope was reinserted and this time, the procedure was repeated in the right posterior column.  Again local anesthetic was injected.  A 3-0 chromic gut suture was placed at the hemorrhoidal column apex.  An incision was made on the anoderm and a diamond-shaped section of hemorrhoidal tissue was excised again taking care to preserve the sphincter mechanism.  The mucosal edges and anoderm are again closed with running 3-0 chromic gut suture with good hemostasis achieved.  Further local anesthesia was injected.  Two pieces of Gelfoam are inserted into the anal canal.  Dressings were applied to the perineum after cleansing the patient.  The patient was awakened from anesthesia and taken to the recovery room in stable condition.  The patient tolerated the procedure well. DD:  11/09/00 TD:  11/09/00 Job: [id number]

## 2011-03-25 NOTE — Op Note (Signed)
NAME:  Darin Pitts, Darin Pitts                         ACCOUNT NO.:  [id number]   MEDICAL RECORD NO.:  [id number]                   PATIENT TYPE:  AMB   LOCATION:  ENDO                                 FACILITY:  [facility]   PHYSICIAN:  Anselmo Rod, M.D.               DATE OF BIRTH:  02-Mar-1956   DATE OF PROCEDURE:  06/30/2003  DATE OF DISCHARGE:                                 OPERATIVE REPORT   PROCEDURE PERFORMED:  Colonoscopy with snare polypectomy x1.   ENDOSCOPIST:  Anselmo Rod, M.D.   INSTRUMENT USED:  Olympus video colonoscope.   INDICATION FOR PROCEDURE:  A 55 year old African-American male with a  history of rectal bleeding.  The patient has had a hemorrhoidectomy done by  Dr. Gerrit Friends in the past.  The patient presents with recurrent rectal  bleeding.  Rule out colonic polyps, masses, etc.   PREPROCEDURE PHYSICAL:  Informed consent was procured from the patient.  The  patient was fasted for eight hours prior to the procedure and prepped with a  bottle of magnesium citrate and a gallon of GoLYTELY the night prior to the  procedure.   PREPROCEDURE PHYSICAL:  VITAL SIGNS:  The patient had stable vital signs.  NECK:  Supple.  CHEST:  Clear to auscultation.  S1, S2 regular.  ABDOMEN:  Soft with normal bowel sounds.   DESCRIPTION OF PROCEDURE:  The patient was placed in the left lateral  decubitus position and sedated with 70 mg of Demerol and 10 mg of Versed  intravenously.  Once the patient was adequately sedate and maintained on low-  flow oxygen and continuous cardiac monitoring, the Olympus video colonoscope  was advanced from the rectum to the cecum.  The appendiceal orifice and the  ileocecal valve were clearly visualized and photographed.  There was a small  amount of residual stool in the right colon, and multiple washes were done.  A small sessile polyp was removed by cold snare polypectomy from the  proximal right colon.  Small external hemorrhoids were seen on  anal  inspection, and a prominent internal hemorrhoid was seen on retroflexion in  the rectum.  No other masses or polyps were present.  The patient tolerated  the procedure well without complications.   IMPRESSION:  1. Small sessile polyp snared from proximal right colon.  2. Small external hemorrhoid.  3. Prominent internal hemorrhoids.   RECOMMENDATIONS:  1. Await pathology results.  2. Anusol-HC 2.5% suppositories one p.r. q.h.s., #30, have been prescribed     for him.  This has been called in to his     pharmacy.  3. Avoid nonsteroidals including aspirin for now.  4. Outpatient follow-up in the next two weeks for further recommendations.  Anselmo Rod, M.D.    JNM/MEDQ  D:  06/30/2003  T:  07/01/2003  Job:  [id number]   cc:   Gabriel Earing, M.D.  [address]  [place]  [place] [address]  Fax: [phone]   Velora Heckler, M.D.  [address] [place]  [place] [address]  Fax: [phone]

## 2011-04-05 ENCOUNTER — Telehealth: Payer: Self-pay | Admitting: Cardiovascular Disease

## 2011-04-05 NOTE — Telephone Encounter (Signed)
bp is fine, he was questioning cholesterol med. Pt to return in 3 mo for labs and at that time if lab values are abnormal dr Ian Bushman wants to switch him to lipitor but not at this time.Pt verbalized understanding. Alfonso Ramus RN

## 2011-04-05 NOTE — Telephone Encounter (Signed)
PATIENT SAID WHEN HE WAS LAST HERE HE WAS TOLD A BP MED WOULD BE CALLED INTO [facility] ON [address] FOR HIM TO START TAKING. PHARMACY KEEPS SAYING THEY DON'T KNOW ANYTHING ABOUT IT. PLEASE LOOK INTO AND CALL PATIENT.

## 2011-05-03 ENCOUNTER — Telehealth: Payer: Self-pay | Admitting: Cardiovascular Disease

## 2011-05-03 NOTE — Telephone Encounter (Signed)
Wanted documents for transport physical, copy of last visit, ekg, labs, and last nuclear test was given to pt.Alfonso Ramus RN

## 2011-06-13 ENCOUNTER — Other Ambulatory Visit: Payer: 59 | Admitting: *Deleted

## 2011-06-27 ENCOUNTER — Other Ambulatory Visit (INDEPENDENT_AMBULATORY_CARE_PROVIDER_SITE_OTHER): Payer: 59 | Admitting: *Deleted

## 2011-06-27 DIAGNOSIS — I2510 Atherosclerotic heart disease of native coronary artery without angina pectoris: Secondary | ICD-10-CM

## 2011-06-27 LAB — BASIC METABOLIC PANEL
CO2: 24 mEq/L (ref 19–32)
Chloride: 108 mEq/L (ref 96–112)
Potassium: 3.8 mEq/L (ref 3.5–5.1)
Sodium: 140 mEq/L (ref 135–145)

## 2011-06-27 LAB — HEPATIC FUNCTION PANEL
ALT: 35 U/L (ref 0–53)
AST: 31 U/L (ref 0–37)
Alkaline Phosphatase: 32 U/L — ABNORMAL LOW (ref 39–117)
Bilirubin, Direct: 0.1 mg/dL (ref 0.0–0.3)
Total Bilirubin: 0.8 mg/dL (ref 0.3–1.2)
Total Protein: 6.9 g/dL (ref 6.0–8.3)

## 2011-06-27 LAB — LIPID PANEL
LDL Cholesterol: 65 mg/dL (ref 0–99)
Total CHOL/HDL Ratio: 3
Triglycerides: 54 mg/dL (ref 0.0–149.0)

## 2011-08-12 LAB — HEMOGLOBIN AND HEMATOCRIT, BLOOD: HCT: 41.2

## 2011-12-26 ENCOUNTER — Other Ambulatory Visit: Payer: Self-pay | Admitting: *Deleted

## 2011-12-26 MED ORDER — SIMVASTATIN 20 MG PO TABS: 20.0000 mg | ORAL_TABLET | Freq: Every day | ORAL | Status: DC

## 2011-12-26 MED ORDER — METOPROLOL TARTRATE 25 MG PO TABS: 25.0000 mg | ORAL_TABLET | Freq: Two times a day (BID) | ORAL | Status: DC

## 2012-03-12 ENCOUNTER — Encounter: Payer: Self-pay | Admitting: Cardiovascular Disease

## 2012-03-12 ENCOUNTER — Ambulatory Visit (INDEPENDENT_AMBULATORY_CARE_PROVIDER_SITE_OTHER): Payer: 59 | Admitting: Cardiovascular Disease

## 2012-03-12 VITALS — BP 119/70 | HR 62 | Ht 70.0 in | Wt 220.8 lb

## 2012-03-12 DIAGNOSIS — E785 Hyperlipidemia, unspecified: Secondary | ICD-10-CM

## 2012-03-12 DIAGNOSIS — I2510 Atherosclerotic heart disease of native coronary artery without angina pectoris: Secondary | ICD-10-CM

## 2012-03-12 LAB — LIPID PANEL
LDL Cholesterol: 60 mg/dL (ref 0–99)
Total CHOL/HDL Ratio: 2
Triglycerides: 45 mg/dL (ref 0.0–149.0)

## 2012-03-12 LAB — BASIC METABOLIC PANEL
BUN: 10 mg/dL (ref 6–23)
CO2: 25 mEq/L (ref 19–32)
Calcium: 9.1 mg/dL (ref 8.4–10.5)
Chloride: 108 mEq/L (ref 96–112)
Creatinine, Ser: 1.1 mg/dL (ref 0.4–1.5)
Glucose, Bld: 100 mg/dL — ABNORMAL HIGH (ref 70–99)

## 2012-03-12 LAB — HEPATIC FUNCTION PANEL
Albumin: 4.2 g/dL (ref 3.5–5.2)
Alkaline Phosphatase: 31 U/L — ABNORMAL LOW (ref 39–117)
Total Bilirubin: 0.7 mg/dL (ref 0.3–1.2)

## 2012-03-12 NOTE — Progress Notes (Signed)
    Darin Pitts Date of Birth  [birth year]       [facility] Office [address] [place], [place]  [address] [phone]   Fax  [phone]   [facility] [address] [place], [place]  [address] [phone]  Fax [phone]  Problem List: 1. Coronary artery disease-status post CABG by Dr. Ricciardelli Moras in November, 2010 2. Hyperglycemia 3. Hyperlipidemia  History of Present Illness:  Darin Pitts is a 56 y.o. gentleman with a history of coronary artery disease. He has not had any difficulty since his bypass grafting in 2010. 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  He tries to eat a low fat diet.  Current Outpatient Prescriptions on File Prior to Visit  Medication Sig Dispense Refill  . aspirin 325 MG tablet Take 325 mg by mouth daily.        . metoprolol tartrate (LOPRESSOR) 25 MG tablet Take 1 tablet (25 mg total) by mouth 2 (two) times daily.  60 tablet  3  . Multiple Vitamin (MULTIVITAMIN) tablet Take 1 tablet by mouth daily.        . nitroGLYCERIN (NITROSTAT) 0.4 MG SL tablet Place 0.4 mg under the tongue every 5 (five) minutes as needed.        . OXYCODONE HCL PO Take by mouth as needed.        . simvastatin (ZOCOR) 20 MG tablet Take 1 tablet (20 mg total) by mouth at bedtime.  30 tablet  3    No Known Allergies  Past Medical History  Diagnosis Date  . CAD (coronary artery disease)     CABG  DR. Doughty Moras  . Hyperglycemia     Past Surgical History  Procedure Date  . Hemorrhoid surgery     History  Smoking status  . Never Smoker   Smokeless tobacco  . Not on file    History  Alcohol Use No    Family History  Problem Relation Age of Onset  . Hypertension Father   . Diabetes Father   . Hypertension Mother   . Stroke Mother   . Diabetes Mother   . Alzheimer's disease Mother   . Hypertension Brother   . Diabetes Brother     Reviw of Systems:    Reviewed in the HPI.  All other systems are negative.  Physical Exam: Blood pressure 119/70, pulse 62, height 5\' 10"  (1.778 m), weight 220 lb 12.8 oz (100.154 kg). General: Well developed, well nourished, in no acute distress.  Head: Normocephalic, atraumatic, sclera non-icteric, mucus membranes are moist,   Neck: Supple. Carotids are 2 + without bruits. No JVD  Lungs: Clear bilaterally to auscultation.  Heart: regular rate.  normal  S1 S2. No murmurs, gallops or rubs.  His sternotomy scar is well healed.  Abdomen: Soft, non-tender,  normal bowel sounds. No hepatomegaly. No rebound/guarding. No masses.  Msk:  Strength and tone are normal  Extremities: No clubbing or cyanosis. No edema.  Distal pedal pulses are 2+     Neuro: Alert and oriented X 3. Moves all extremities spontaneously.  Psych:  Responds to questions appropriately with a normal affect.  ECG: Mar 12, 2012-normal sinus rhythm at 65 beats a minute. Normal EKG.  Assessment / Plan:

## 2012-03-12 NOTE — Patient Instructions (Signed)
Your physician recommends that you return for a FASTING lipid profile: TODAY AND IN 1 YEAR   Your physician wants you to follow-up in: 1 YEAR  You will receive a reminder letter in the mail two months in advance. If you don't receive a letter, please call our office to schedule the follow-up appointment.   

## 2012-03-12 NOTE — Assessment & Plan Note (Signed)
Darin Pitts is doing very well. We will check a fasting labs today. I'll see him again in one year and will check fasting labs at that time. I'm pleased with his progress.

## 2012-03-26 ENCOUNTER — Other Ambulatory Visit: Payer: Self-pay | Admitting: *Deleted

## 2012-03-26 DIAGNOSIS — Z951 Presence of aortocoronary bypass graft: Secondary | ICD-10-CM

## 2012-03-26 NOTE — Progress Notes (Signed)
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

## 2012-04-16 ENCOUNTER — Ambulatory Visit (INDEPENDENT_AMBULATORY_CARE_PROVIDER_SITE_OTHER): Payer: 59 | Admitting: Nurse Practitioner

## 2012-04-16 ENCOUNTER — Encounter: Payer: Self-pay | Admitting: Cardiovascular Disease

## 2012-04-16 ENCOUNTER — Encounter: Payer: Self-pay | Admitting: Nurse Practitioner

## 2012-04-16 VITALS — BP 104/64 | HR 70 | Ht 70.0 in | Wt 220.0 lb

## 2012-04-16 DIAGNOSIS — R0989 Other specified symptoms and signs involving the circulatory and respiratory systems: Secondary | ICD-10-CM

## 2012-04-16 DIAGNOSIS — Z951 Presence of aortocoronary bypass graft: Secondary | ICD-10-CM

## 2012-04-16 NOTE — Procedures (Signed)
Exercise Treadmill Test  Pre-Exercise Testing Evaluation Rhythm: Sinus  Rate: 70   PR:  .21 QRS:  .08  QT:  .37 QTc: .40     Test  Exercise Tolerance Test Ordering MD: Kristeen Miss, MD  Interpreting MD: Norma Fredrickson NP  Unique Test No: 1  Treadmill:  1  Indication for ETT: known ASHD  Contraindication to ETT: No   Stress Modality: exercise - treadmill  Cardiac Imaging Performed: non   Protocol: standard Bruce - maximal  Max BP:  164/77  Max MPHR (bpm):  164 85% MPR (bpm):  139  MPHR obtained (bpm):  162 % MPHR obtained: 98%  Reached 85% MPHR (min:sec):  9:00  Total Exercise Time (min-sec):  12:00  Workload in METS:  13.4 Borg Scale: 17  Reason ETT Terminated:  patient's desire to stop    ST Segment Analysis At Rest: normal ST segments - no evidence of significant ST depression With Exercise: no evidence of significant ST depression  Other Information Arrhythmia:  No Angina during ETT:  absent (0) Quality of ETT:  diagnostic  ETT Interpretation:  normal - no evidence of ischemia by ST analysis  Comments: Patient presents today for 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 Clinically doing well without any cardiac complaints. Seen by Dr. Elease Hashimoto last month. Exercised today on the standard Bruce protocol for a total of 12 minutes. Excellent exercise tolerance. Adequate blood pressure response. Clinically negative. EKG negative. No arrhythmia noted.   Recommendations: Satisfactory GXT. Will see again in one year. Patient is agreeable to this plan and will call if any problems develop in the interim.

## 2012-04-16 NOTE — Patient Instructions (Signed)
Plan for GXT in one year.   Call the [facility] office at [phone] if you have any questions, problems or concerns.

## 2012-04-19 ENCOUNTER — Telehealth: Payer: Self-pay | Admitting: Cardiovascular Disease

## 2012-04-19 ENCOUNTER — Encounter: Payer: Self-pay | Admitting: *Deleted

## 2012-04-19 NOTE — Telephone Encounter (Signed)
Faxed letter to request.

## 2012-04-19 NOTE — Telephone Encounter (Signed)
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

## 2012-05-14 ENCOUNTER — Other Ambulatory Visit: Payer: Self-pay | Admitting: *Deleted

## 2012-05-14 MED ORDER — SIMVASTATIN 20 MG PO TABS: 20.0000 mg | ORAL_TABLET | Freq: Every day | ORAL | Status: DC

## 2012-05-14 MED ORDER — METOPROLOL TARTRATE 25 MG PO TABS: 25.0000 mg | ORAL_TABLET | Freq: Two times a day (BID) | ORAL | Status: DC

## 2012-05-24 ENCOUNTER — Telehealth: Payer: Self-pay | Admitting: Cardiovascular Disease

## 2012-05-24 MED ORDER — SAXAGLIPTIN-METFORMIN ER 5-500 MG PO TB24: 1.0000 | ORAL_TABLET | Freq: Every day | ORAL | Status: DC

## 2012-05-24 MED ORDER — ATORVASTATIN CALCIUM 20 MG PO TABS: 20.0000 mg | ORAL_TABLET | Freq: Every day | ORAL | Status: DC

## 2012-05-24 NOTE — Telephone Encounter (Signed)
PT'S WIFE CALLING RE PT BEING ON A COUPLE MEDS WE MAY NOT BE AWARE OF, HE'S TAKING ATORAVASTATIN AND KOMBIGLYZE XR5 FOR BP BY DR Allyne Gee, PLS CALL IF HE NEEDS TO ADJUST MEDS [phone]

## 2012-05-24 NOTE — Telephone Encounter (Signed)
Told his wife to have him stop simvastatin/ he is not to take both cholesterol meds, he was started on atorvastatin approx 2 weeks ago per wife, told her he needs lab test to check liver, she was calling pcp back to update them. Encouraged to call with questions or concerns.

## 2012-05-28 ENCOUNTER — Ambulatory Visit (INDEPENDENT_AMBULATORY_CARE_PROVIDER_SITE_OTHER): Payer: 59 | Admitting: *Deleted

## 2012-05-28 DIAGNOSIS — I2510 Atherosclerotic heart disease of native coronary artery without angina pectoris: Secondary | ICD-10-CM

## 2012-05-28 DIAGNOSIS — R739 Hyperglycemia, unspecified: Secondary | ICD-10-CM

## 2012-05-28 DIAGNOSIS — R7309 Other abnormal glucose: Secondary | ICD-10-CM

## 2012-05-28 LAB — HEPATIC FUNCTION PANEL
ALT: 55 U/L — ABNORMAL HIGH (ref 0–53)
AST: 36 U/L (ref 0–37)
Albumin: 4.2 g/dL (ref 3.5–5.2)
Total Bilirubin: 0.4 mg/dL (ref 0.3–1.2)
Total Protein: 6.7 g/dL (ref 6.0–8.3)

## 2012-06-11 ENCOUNTER — Other Ambulatory Visit: Payer: Self-pay | Admitting: Cardiovascular Disease

## 2012-06-11 MED ORDER — METOPROLOL TARTRATE 25 MG PO TABS: 25.0000 mg | ORAL_TABLET | Freq: Two times a day (BID) | ORAL | Status: DC

## 2012-07-02 ENCOUNTER — Telehealth: Payer: Self-pay | Admitting: Cardiovascular Disease

## 2012-07-02 NOTE — Telephone Encounter (Signed)
Pt calling re metoprolol, and read the back and saw that side effects could be sexual dysfunction, PLS CALL [phone] [facility] [address]

## 2012-07-02 NOTE — Telephone Encounter (Signed)
Discussed med with Dr Elease Hashimoto and he feels it is an appropriate med for him to be on in light of his CAD HX, pt informed of advise and told  to discuss further at next ov, pt agreed to plan.

## 2012-11-08 ENCOUNTER — Telehealth: Payer: Self-pay | Admitting: Cardiovascular Disease

## 2012-11-08 NOTE — Telephone Encounter (Signed)
New problem:   Need an order for stress test.  For DOT physical .

## 2012-11-08 NOTE — Telephone Encounter (Signed)
Pt had last GXT 04/2012, pt does not need another one ordered at this time after we discussed, pt will wait to discuss further when it is time for his yearly.

## 2013-02-12 ENCOUNTER — Telehealth: Payer: Self-pay | Admitting: Cardiovascular Disease

## 2013-02-12 ENCOUNTER — Telehealth: Payer: Self-pay | Admitting: *Deleted

## 2013-02-12 NOTE — Telephone Encounter (Signed)
Walk In Pt Form " Pt Needs Statement about CABG" Sent to [facility]  02/12/13/KM

## 2013-02-12 NOTE — Telephone Encounter (Signed)
Pt dropped off DOT physical form to be filled out, pt needs an app, he may be set with Norma Fredrickson NP or Dr Elease Hashimoto.

## 2013-02-14 NOTE — Telephone Encounter (Signed)
Follow up    Pt returning your call from yesterday

## 2013-02-14 NOTE — Telephone Encounter (Signed)
Scheduling contacted and they will contact pt to set app for DOT physical. Needs app per Dr Elease Hashimoto.

## 2013-03-01 ENCOUNTER — Ambulatory Visit (INDEPENDENT_AMBULATORY_CARE_PROVIDER_SITE_OTHER): Payer: 59 | Admitting: Nurse Practitioner

## 2013-03-01 ENCOUNTER — Encounter: Payer: Self-pay | Admitting: Nurse Practitioner

## 2013-03-01 VITALS — BP 118/82 | HR 60 | Ht 70.0 in | Wt 224.0 lb

## 2013-03-01 DIAGNOSIS — I25810 Atherosclerosis of coronary artery bypass graft(s) without angina pectoris: Secondary | ICD-10-CM

## 2013-03-01 NOTE — Progress Notes (Signed)
Darin Pitts Date of Birth: 07-31-1956 Medical Record #[id number]  History of Present Illness: Darin Pitts is seen back today for a one year check. Seen for Dr. Elease Hashimoto. Has known CAD with remote CABG in November of 2010. Other issues include NIDDM and HLD.   Last seen in May of 2013. Was doing well. Had negative GXT in June of 2013.   He comes back today. He is here alone. Doing well. Needs a DOT paper filled out for him. No symptoms. No chest pain. Not short of breath. Exercises regularly. Feels good on his medicines. Not dizzy or lightheaded. No swelling. No passing out spells. Very active and overall, he feels good.  Current Outpatient Prescriptions on File Prior to Visit  Medication Sig Dispense Refill  . aspirin 325 MG tablet Take 325 mg by mouth daily.        Marland Kitchen atorvastatin (LIPITOR) 20 MG tablet Take 1 tablet (20 mg total) by mouth daily.      . metoprolol tartrate (LOPRESSOR) 25 MG tablet Take 1 tablet (25 mg total) by mouth 2 (two) times daily.  60 tablet  11  . Multiple Vitamin (MULTIVITAMIN) tablet Take 1 tablet by mouth daily.        . Saxagliptin-Metformin (KOMBIGLYZE XR) 5-500 MG TB24 Take 1 tablet by mouth daily.  30 tablet     No current facility-administered medications on file prior to visit.    No Known Allergies  Past Medical History  Diagnosis Date  . CAD (coronary artery disease) 2010    CABG x 1  DR. OWEN  . Hyperglycemia     Past Surgical History  Procedure Laterality Date  . Hemorrhoid surgery      History  Smoking status  . Never Smoker   Smokeless tobacco  . Not on file    History  Alcohol Use No    Family History  Problem Relation Age of Onset  . Hypertension Father   . Diabetes Father   . Hypertension Mother   . Stroke Mother   . Diabetes Mother   . Alzheimer's disease Mother   . Hypertension Brother   . Diabetes Brother     Review of Systems: The review of systems is per the HPI.  All other systems were reviewed and  are negative.  Physical Exam: BP 118/82  Pulse 60  Ht 5\' 10"  (1.778 m)  Wt 224 lb (101.606 kg)  BMI 32.14 kg/m2 Patient is very pleasant and in no acute distress. Skin is warm and dry. Color is normal.  HEENT is unremarkable. Normocephalic/atraumatic. PERRL. Sclera are nonicteric. Neck is supple. No masses. No JVD. Lungs are clear. Cardiac exam shows a regular rate and rhythm. Abdomen is soft. Extremities are without edema. Gait and ROM are intact. No gross neurologic deficits noted.  LABORATORY DATA:  EKG today shows sinus rhythm with 1st degree AV block. PR of .22.  No acute changes noted.    Lab Results  Component Value Date   WBC 9.9 10/10/2009   HGB 12.3* 10/10/2009   HCT 35.8* 10/10/2009   PLT 127* 10/10/2009   GLUCOSE 100* 03/12/2012   CHOL 120 03/12/2012   TRIG 45.0 03/12/2012   HDL 50.60 03/12/2012   LDLCALC 60 03/12/2012   ALT 55* 05/28/2012   AST 36 05/28/2012   NA 143 03/12/2012   K 4.2 03/12/2012   CL 108 03/12/2012   CREATININE 1.1 03/12/2012   BUN 10 03/12/2012   CO2 25 03/12/2012   INR  1.09 10/08/2009   HGBA1C  Value: 7.0 (NOTE) The ADA recommends the following therapeutic goal for glycemic control related to Hgb A1c measurement: Goal of therapy: <6.5 Hgb A1c  Reference: American Diabetes Association: Clinical Practice Recommendations 2010, Diabetes Care, 2010, 33: (Suppl  1).* 10/07/2009   GXT from June 2013  ETT Interpretation: normal - no evidence of ischemia by ST analysis   Comments:  Patient presents today for 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 Clinically doing well without any cardiac complaints. Seen by Dr. Elease Hashimoto last month. Exercised today on the standard Bruce protocol for a total of 12 minutes. Excellent exercise tolerance. Adequate blood pressure response. Clinically negative. EKG negative. No arrhythmia noted.   Recommendations:  Satisfactory GXT. Will see again in one year. Patient is agreeable to this plan and will call if any problems develop in the interim.     Assessment / Plan: 1. CAD - prior CABG in 2010 - negative GXT in June of 2013 - he is doing well clinically. See Dr. Elease Hashimoto back in 6 months. Obtain post CABG echo for his DOT requirement.   2. NIDDM - followed by his PCP  3. HLD - labs per his PCP  Tentatively see him back in 6 months.   Patient is agreeable to this plan and will call if any problems develop in the interim.   Rosalio Macadamia, RN, ANP-C [facility] [address] [place], [place]  [address]

## 2013-03-01 NOTE — Patient Instructions (Signed)
I think you are doing well  We will get an ultrasound of your heart for your DOT physical  See Dr. Elease Hashimoto in 6 months  Stay on your current medicines and stay active!!  Call the [facility] office at [phone] if you have any questions, problems or concerns.

## 2013-03-01 NOTE — Addendum Note (Signed)
**Note De-Identified Sachit Gilman Obfuscation** Addended by: Demetrios Loll on: 03/01/2013 12:12 PM   Modules accepted: Orders

## 2013-03-04 ENCOUNTER — Ambulatory Visit (HOSPITAL_COMMUNITY): Payer: 59 | Attending: Cardiology

## 2013-03-04 DIAGNOSIS — E119 Type 2 diabetes mellitus without complications: Secondary | ICD-10-CM | POA: Insufficient documentation

## 2013-03-04 DIAGNOSIS — E785 Hyperlipidemia, unspecified: Secondary | ICD-10-CM | POA: Insufficient documentation

## 2013-03-04 DIAGNOSIS — I2510 Atherosclerotic heart disease of native coronary artery without angina pectoris: Secondary | ICD-10-CM

## 2013-03-04 DIAGNOSIS — Z951 Presence of aortocoronary bypass graft: Secondary | ICD-10-CM | POA: Insufficient documentation

## 2013-03-04 DIAGNOSIS — I25810 Atherosclerosis of coronary artery bypass graft(s) without angina pectoris: Secondary | ICD-10-CM

## 2013-03-04 NOTE — Progress Notes (Signed)
Echocardiogram performed.  

## 2013-05-03 ENCOUNTER — Ambulatory Visit: Payer: 59 | Admitting: Cardiovascular Disease

## 2013-07-16 ENCOUNTER — Other Ambulatory Visit: Payer: Self-pay

## 2013-07-16 MED ORDER — METOPROLOL TARTRATE 25 MG PO TABS: 25.0000 mg | ORAL_TABLET | Freq: Two times a day (BID) | ORAL | Status: DC

## 2013-08-23 ENCOUNTER — Telehealth: Payer: Self-pay | Admitting: Cardiovascular Disease

## 2013-08-30 ENCOUNTER — Encounter: Payer: Self-pay | Admitting: Cardiovascular Disease

## 2013-09-09 ENCOUNTER — Ambulatory Visit (INDEPENDENT_AMBULATORY_CARE_PROVIDER_SITE_OTHER): Payer: 59 | Admitting: Cardiovascular Disease

## 2013-09-09 ENCOUNTER — Encounter: Payer: Self-pay | Admitting: Cardiovascular Disease

## 2013-09-09 VITALS — BP 110/70 | HR 60 | Ht 70.0 in | Wt 222.0 lb

## 2013-09-09 DIAGNOSIS — I2510 Atherosclerotic heart disease of native coronary artery without angina pectoris: Secondary | ICD-10-CM

## 2013-09-09 LAB — LIPID PANEL
LDL Cholesterol: 51 mg/dL (ref 0–99)
Total CHOL/HDL Ratio: 2
Triglycerides: 84 mg/dL (ref 0.0–149.0)
VLDL: 16.8 mg/dL (ref 0.0–40.0)

## 2013-09-09 LAB — BASIC METABOLIC PANEL
Calcium: 9.6 mg/dL (ref 8.4–10.5)
GFR: 86.65 mL/min (ref >60.00)
Glucose, Bld: 110 mg/dL — ABNORMAL HIGH (ref 70–99)
Sodium: 140 mEq/L (ref 135–145)

## 2013-09-09 LAB — HEPATIC FUNCTION PANEL
Albumin: 4.1 g/dL (ref 3.5–5.2)
Alkaline Phosphatase: 34 U/L — ABNORMAL LOW (ref 39–117)
Total Protein: 7 g/dL (ref 6.0–8.3)

## 2013-09-09 NOTE — Patient Instructions (Addendum)
Your physician recommends that you return for lab work in: today bmet lipid liver  Your physician wants you to follow-up in: 1 year with ekg, You will receive a reminder letter in the mail two months in advance. If you don't receive a letter, please call our office to schedule the follow-up appointment.   [facility] Low Glycemic Diet (Source: [facility], 2006) Low Glycemic Foods (20-49) (Decrease risk of developing heart disease) Breakfast Cereals: All-Bran All-Bran Fruit 'n Oats Fiber One Oatmeal (not instant) Oat bran Fruits and fruit juices: (Limit to 1-2 servings per day) Apples Apricots (fresh & dried) Blackberries Blueberries Cherries Cranberries Peaches Pears Plums Prunes Grapefruit Raspberries Strawberries Tangerine Apple juice Grapefruit juice Tomato juice Beans and legumes (fresh-cooked): Black-eyed peas Butter beans Chick peas Lentils  Green beans Lima beans Kidney beans Navy beans Pinto beans Snow peas Non-starchy vegetables: Asparagus, avocado, broccoli, cabbage, cauliflower, celery, cucumber, greens, lettuce, mushrooms, peppers, tomatoes, okra, onions, spinach, summer squash Grains: Barley Bulgur Rye Wild rice Nuts and oils : Almonds Peanuts Sunflower seeds Hazelnuts Pecans Walnuts Oils that are liquid at room temperature Dairy, fish, meat, soy, and eggs: Milk, skim Lowfat cheese Yogurt, lowfat, fruit sugar sweetened Lean red meat Fish  Skinless chicken & [place] Shellfish Egg whites (up to 3 daily) Soy products  Egg yolks (up to 7 or _____ per week) Moderate Glycemic Foods (50-69) Breakfast Cereals: Bran Buds Bran Chex Just Right Mini-Wheats  Special K Swiss muesli Fruits: Banana (under-ripe) Dates Figs Grapes Kiwi Mango Oranges Raisins Fruit Juices: Cranberry juice Orange juice Beans and legumes: Boston-type baked beans Canned pinto, kidney, or navy beans Green peas Vegetables: Beets Carrots  Sweet potato Yam Corn  on the cob Breads: Pita (pocket) bread Oat bran bread Pumpernickel bread Rye bread Wheat bread, high fiber  Grains: Cornmeal Rice, brown Rice, white Couscous Pasta: Macaroni Pizza, cheese Ravioli, meat filled Spaghetti, white  Nuts: Cashews Macadamia Snacks: Chocolate Ice cream, lowfat Muffin Popcorn High Glycemic Foods (70-100)  Breakfast Cereals: Cheerios Corn Chex Corn Flakes Cream of Wheat Grape Nuts Grape Nut Flakes Grits Nutri-Grain Puffed Rice Puffed Wheat Rice Chex Rice Krispies Shredded Wheat Team Total Fruits: Pineapple Watermelon Banana (over-ripe) Beverages: Sodas, sweet tea, pineapple juice Vegetables: Potato, baked, boiled, fried, mashed Jamaica fries Canned or frozen corn Parsnips Winter squash Breads: Most breads (white and whole grain) Bagels Bread sticks Bread stuffing Kaiser roll Dinner rolls Grains: Rice, instant Tapioca, with milk Candy and most cookies Snacks: Donuts Corn chips Jelly beans Pretzels Pastries

## 2013-09-09 NOTE — Assessment & Plan Note (Signed)
This is being followed by his medical doctor. We'll continue to be aggressive with his lipid lowering medications. His LDL goal is less than 70.

## 2013-09-09 NOTE — Assessment & Plan Note (Signed)
Darin Pitts is doing well. He's not had any episodes of chest pain or shortness of breath. We'll continue the same medications. We'll check fasting lipids, liver enzymes, and hepatic profile today.  I'll see him again in one year for followup office visit. We'll check fasting labs again at that time as well as an EKG.

## 2013-09-09 NOTE — Progress Notes (Signed)
Darin Pitts Date of Birth  Jul 29, 1956       [facility] Office [address] [place], [place]  [address] [phone]   Fax  [phone]   [facility] [address] [place], [place]  [address] [phone]  Fax [phone]  Problem List: 1. Coronary artery disease-status post CABG by Dr. Faulkenberry Moras in November, 2010 2. Diabetes Mellitus 3. Hyperlipidemia  History of Present Illness:  Darin Pitts is a 57 y.o. gentleman with a history of coronary artery disease. He has not had any difficulty since his bypass grafting in 2010. 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  He tries to eat a low fat diet.  Nov. 3, 2014:  Darin Pitts has been seen by [name] since I last saw him.   No complaints.  He still works as a Naval architect for [employer].    He is exercising regularly.    Current Outpatient Prescriptions on File Prior to Visit  Medication Sig Dispense Refill  . aspirin 325 MG tablet Take 325 mg by mouth daily.        . metoprolol tartrate (LOPRESSOR) 25 MG tablet Take 1 tablet (25 mg total) by mouth 2 (two) times daily.  60 tablet  4  . Multiple Vitamin (MULTIVITAMIN) tablet Take 1 tablet by mouth daily.        . Saxagliptin-Metformin (KOMBIGLYZE XR) 5-500 MG TB24 Take 1 tablet by mouth daily.  30 tablet    . atorvastatin (LIPITOR) 20 MG tablet Take 1 tablet (20 mg total) by mouth daily.       No current facility-administered medications on file prior to visit.    No Known Allergies  Past Medical History  Diagnosis Date  . CAD (coronary artery disease) 2010    CABG x 1  DR. OWEN  . Hyperglycemia     Past Surgical History  Procedure Laterality Date  . Hemorrhoid surgery      History  Smoking status  . Never Smoker   Smokeless tobacco  . Not on file    History  Alcohol Use No    Family History  Problem Relation Age of Onset  . Hypertension Father   .  Diabetes Father   . Hypertension Mother   . Stroke Mother   . Diabetes Mother   . Alzheimer's disease Mother   . Hypertension Brother   . Diabetes Brother     Reviw of Systems:  Reviewed in the HPI.  All other systems are negative.  Physical Exam: Blood pressure 110/70, pulse 60, height 5\' 10"  (1.778 m), weight 222 lb (100.699 kg). General: Well developed, well nourished, in no acute distress.  Head: Normocephalic, atraumatic, sclera non-icteric, mucus membranes are moist,   Neck: Supple. Carotids are 2 + without bruits. No JVD  Lungs: Clear bilaterally to auscultation.  Heart: regular rate.  normal  S1 S2. No murmurs, gallops or rubs.  His sternotomy scar is well healed.  Abdomen: Soft, non-tender,  normal bowel sounds. No hepatomegaly. No rebound/guarding. No masses.  Msk:  Strength and tone are normal  Extremities: No clubbing or cyanosis. No edema.  Distal pedal pulses are 2+     Neuro: Alert and oriented X 3. Moves all extremities spontaneously.  Psych:  Responds to questions appropriately with a normal affect.  ECG: Mar 12, 2012-normal sinus rhythm at 65 beats a minute. Normal EKG.  Assessment /  Plan:

## 2013-09-10 DIAGNOSIS — Z0279 Encounter for issue of other medical certificate: Secondary | ICD-10-CM

## 2014-01-28 ENCOUNTER — Telehealth: Payer: Self-pay | Admitting: Cardiovascular Disease

## 2014-01-28 NOTE — Telephone Encounter (Signed)
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

## 2014-01-29 ENCOUNTER — Telehealth: Payer: Self-pay | Admitting: *Deleted

## 2014-01-29 NOTE — Telephone Encounter (Signed)
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  Paperwork taken to MR to be faxed.

## 2014-02-06 ENCOUNTER — Other Ambulatory Visit: Payer: Self-pay | Admitting: Cardiovascular Disease

## 2014-05-16 NOTE — Telephone Encounter (Signed)
Closed encounter °

## 2014-07-14 ENCOUNTER — Other Ambulatory Visit: Payer: Self-pay | Admitting: Cardiovascular Disease

## 2014-07-17 ENCOUNTER — Other Ambulatory Visit: Payer: Self-pay | Admitting: Cardiovascular Disease

## 2014-09-15 ENCOUNTER — Other Ambulatory Visit (INDEPENDENT_AMBULATORY_CARE_PROVIDER_SITE_OTHER): Payer: 59

## 2014-09-15 ENCOUNTER — Encounter: Payer: Self-pay | Admitting: Cardiovascular Disease

## 2014-09-15 ENCOUNTER — Ambulatory Visit (INDEPENDENT_AMBULATORY_CARE_PROVIDER_SITE_OTHER): Payer: 59 | Admitting: Cardiovascular Disease

## 2014-09-15 VITALS — BP 115/78 | HR 78 | Ht 70.0 in | Wt 216.4 lb

## 2014-09-15 DIAGNOSIS — I2510 Atherosclerotic heart disease of native coronary artery without angina pectoris: Secondary | ICD-10-CM

## 2014-09-15 DIAGNOSIS — I25810 Atherosclerosis of coronary artery bypass graft(s) without angina pectoris: Secondary | ICD-10-CM

## 2014-09-15 LAB — BASIC METABOLIC PANEL
BUN: 11 mg/dL (ref 6–23)
CO2: 25 meq/L (ref 19–32)
Calcium: 10 mg/dL (ref 8.4–10.5)
Chloride: 108 mEq/L (ref 96–112)
Creatinine, Ser: 1.2 mg/dL (ref 0.4–1.5)
GFR: 83.75 mL/min (ref >60.00)
Glucose, Bld: 111 mg/dL — ABNORMAL HIGH (ref 70–99)
Potassium: 4.2 mEq/L (ref 3.5–5.1)
SODIUM: 139 meq/L (ref 135–145)

## 2014-09-15 LAB — LIPID PANEL
CHOLESTEROL: 139 mg/dL (ref 0–200)
HDL: 45 mg/dL (ref >39.00)
LDL Cholesterol: 73 mg/dL (ref 0–99)
NonHDL: 94
TRIGLYCERIDES: 106 mg/dL (ref 0.0–149.0)
Total CHOL/HDL Ratio: 3
VLDL: 21.2 mg/dL (ref 0.0–40.0)

## 2014-09-15 LAB — HEPATIC FUNCTION PANEL
ALK PHOS: 36 U/L — AB (ref 39–117)
ALT: 40 U/L (ref 0–53)
AST: 32 U/L (ref 0–37)
Albumin: 3.7 g/dL (ref 3.5–5.2)
Bilirubin, Direct: 0 mg/dL (ref 0.0–0.3)
TOTAL PROTEIN: 7.1 g/dL (ref 6.0–8.3)
Total Bilirubin: 0.4 mg/dL (ref 0.2–1.2)

## 2014-09-15 MED ORDER — ASPIRIN EC 81 MG PO TBEC: 81.0000 mg | DELAYED_RELEASE_TABLET | Freq: Every day | ORAL | Status: AC

## 2014-09-15 NOTE — Assessment & Plan Note (Signed)
Loiuis  is doing well. Is not having episodes of chest pain. Continue with same medications. He's not having any issues. He remains active.  Check fasting labs today. I'll see him again in one year.

## 2014-09-15 NOTE — Progress Notes (Signed)
Darin CampbellLouis N Pitts Date of Birth  [birth year]       [facility] [address] [place], [place]  [address] [phone]   Fax  [phone]   [facility] [address] [place], [place]  [address] [phone]  Fax [phone]  Problem List: 1. Coronary artery disease-status post CABG by Dr. Bundren Moraswen in November, 2010 2. Diabetes Mellitus 3. Hyperlipidemia  History of Present Illness:  Darin Pitts is a 58 y.o. gentleman with a history of coronary artery disease. He has not had any difficulty since his bypass grafting in 2010. 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  He tries to eat a low fat diet.  Nov. 3, 2014:  Darin Pitts has been seen by Lawson FiscalLori since I last saw him.   No complaints.  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    He is exercising regularly.   Nov. 9, 2015:  Darin Pitts is doing well.  No angina.  Working out regularly. No issues. Doing well  is fasting for lab work   Current Outpatient Prescriptions on File Prior to Visit  Medication Sig Dispense Refill  . aspirin 325 MG tablet Take 325 mg by mouth daily.      Marland Kitchen. atorvastatin (LIPITOR) 20 MG tablet Take 1 tablet (20 mg total) by mouth daily.    . metoprolol tartrate (LOPRESSOR) 25 MG tablet TAKE 1 TABLET (25 MG TOTAL) BY MOUTH 2 (TWO) TIMES DAILY. 60 tablet 01  . Multiple Vitamin (MULTIVITAMIN) tablet Take 1 tablet by mouth daily.      . Saxagliptin-Metformin (KOMBIGLYZE XR) 5-500 MG TB24 Take 1 tablet by mouth daily. 30 tablet    No current facility-administered medications on file prior to visit.    No Known Allergies  Past Medical History  Diagnosis Date  . CAD (coronary artery disease) 2010    CABG x 1  DR. OWEN  . Hyperglycemia     Past Surgical History  Procedure Laterality Date  . Hemorrhoid surgery      History  Smoking status  . Never Smoker   Smokeless tobacco  . Not on  file    History  Alcohol Use No    Family History  Problem Relation Age of Onset  . Hypertension Father   . Diabetes Father   . Hypertension Mother   . Stroke Mother   . Diabetes Mother   . Alzheimer's disease Mother   . Hypertension Brother   . Diabetes Brother     Reviw of Systems:  Reviewed in the HPI.  All other systems are negative.  Physical Exam: Blood pressure 115/78, pulse 78, height 5\' 10"  (1.778 m), weight 216 lb 6.4 oz (98.158 kg). General: Well developed, well nourished, in no acute distress.  Head: Normocephalic, atraumatic, sclera non-icteric, mucus membranes are moist,   Neck: Supple. Carotids are 2 + without bruits. No JVD  Lungs: Clear bilaterally to auscultation.  Heart: regular rate.  normal  S1 S2. No murmurs, gallops or rubs.  His sternotomy scar is well healed.  Abdomen: Soft, non-tender,  normal bowel sounds. No hepatomegaly. No rebound/guarding. No masses.  Msk:  Strength and tone are normal  Extremities: No clubbing or cyanosis. No edema.  Distal pedal pulses are 2+     Neuro: Alert and oriented X 3. Moves all extremities spontaneously.  Psych:  Responds to questions appropriately with a normal  affect.  ECG: 09/15/2014: Normal sinus rhythm with first degree AV block. Heart rate is 71. EKG is normal.  Assessment / Plan:

## 2014-09-15 NOTE — Patient Instructions (Signed)
Your physician recommends that you continue on your current medications as directed. Please refer to the Current Medication list given to you today.  Your physician recommends that you have lab work: TODAY - fasting cholesterol, liver, basic metabolic panel  Your physician has recommended you make the following change in your medication:  DECREASE Aspirin to 81 mg once daily

## 2014-09-19 ENCOUNTER — Other Ambulatory Visit: Payer: Self-pay | Admitting: Cardiovascular Disease

## 2015-01-07 ENCOUNTER — Encounter: Payer: Self-pay | Admitting: Nurse Practitioner

## 2015-01-16 ENCOUNTER — Telehealth: Payer: Self-pay | Admitting: Cardiovascular Disease

## 2015-01-16 NOTE — Telephone Encounter (Addendum)
Our medical records dept is unaware of the pts DOT paperwork. I have left the pt a message to call us back.

## 2015-01-16 NOTE — Telephone Encounter (Signed)
New problem   Pt want to know status of his DOT physical form. Please advise.

## 2015-01-19 NOTE — Telephone Encounter (Signed)
Pt states he dropped off information at the front desk needed by [facility] that does his DOT physical about 2 weeks ago.  Pt advised I cannot locate this information.  Pt states [facility] that does his DOT physical needs medical records from Dr Elease HashimotoNahser for DOT form.  Pt advised that if he just needs medical records sent to [facility] for DOT form that I will forward this message to HIM to contact him to get phone and fax number for [facility] so medical information can be sent to them.

## 2015-01-19 NOTE — Telephone Encounter (Signed)
I will forward to Dr Nahser for review.  

## 2015-01-19 NOTE — Telephone Encounter (Signed)
I last saw patient November, 2015. BP was ok No cardiac complains. 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

## 2015-01-19 NOTE — Telephone Encounter (Signed)
Exercise TM, LOV,EKG [facility]ed [facility] [place]  At [phone] for DOT Physical.

## 2015-01-19 NOTE — Telephone Encounter (Signed)
F/U         Pt states he is returning call from today about DOT paperwork.   Please call.

## 2015-01-19 NOTE — Telephone Encounter (Signed)
[facility] is asking for Written Clearance for pt to [employer].  Please fax to [phone]  Call back [phone]

## 2015-01-20 NOTE — Telephone Encounter (Signed)
Left detailed message on patient's voice mail that I faxed letter of clearance from Dr. Elease HashimotoNahser, which is in patient's chart, on 3/2.  I advised patient to please call me today if I need to resend letter or mail a copy to the patient.

## 2015-09-21 ENCOUNTER — Encounter: Payer: Self-pay | Admitting: Cardiovascular Disease

## 2015-09-21 ENCOUNTER — Ambulatory Visit (INDEPENDENT_AMBULATORY_CARE_PROVIDER_SITE_OTHER): Payer: 59 | Admitting: Cardiovascular Disease

## 2015-09-21 VITALS — BP 94/70 | HR 79 | Ht 70.0 in | Wt 214.0 lb

## 2015-09-21 DIAGNOSIS — I2510 Atherosclerotic heart disease of native coronary artery without angina pectoris: Secondary | ICD-10-CM | POA: Diagnosis not present

## 2015-09-21 DIAGNOSIS — E785 Hyperlipidemia, unspecified: Secondary | ICD-10-CM

## 2015-09-21 DIAGNOSIS — I25810 Atherosclerosis of coronary artery bypass graft(s) without angina pectoris: Secondary | ICD-10-CM

## 2015-09-21 LAB — COMPREHENSIVE METABOLIC PANEL
ALBUMIN: 4.3 g/dL (ref 3.6–5.1)
ALT: 38 U/L (ref 9–46)
AST: 33 U/L (ref 10–35)
Alkaline Phosphatase: 37 U/L — ABNORMAL LOW (ref 40–115)
BUN: 13 mg/dL (ref 7–25)
CALCIUM: 10 mg/dL (ref 8.6–10.3)
CHLORIDE: 108 mmol/L (ref 98–110)
CO2: 20 mmol/L (ref 20–31)
CREATININE: 1.04 mg/dL (ref 0.70–1.33)
Glucose, Bld: 105 mg/dL — ABNORMAL HIGH (ref 65–99)
POTASSIUM: 4.2 mmol/L (ref 3.5–5.3)
Sodium: 141 mmol/L (ref 135–146)
TOTAL PROTEIN: 7.2 g/dL (ref 6.1–8.1)
Total Bilirubin: 0.5 mg/dL (ref 0.2–1.2)

## 2015-09-21 LAB — LIPID PANEL
CHOL/HDL RATIO: 2.7 ratio (ref <=5.0)
CHOLESTEROL: 137 mg/dL (ref 125–200)
HDL: 50 mg/dL (ref >=40)
LDL Cholesterol: 76 mg/dL (ref <130)
TRIGLYCERIDES: 56 mg/dL (ref <150)
VLDL: 11 mg/dL (ref <30)

## 2015-09-21 NOTE — Patient Instructions (Signed)
Medication Instructions:  Your physician recommends that you continue on your current medications as directed. Please refer to the Current Medication list given to you today.   Labwork: TODAY - cholesterol, liver, basic metabolic panel  Your physician recommends that you return for lab work in: 1 year on the day of or a few days before your office visit with Dr. Nahser.  You will need to FAST for this appointment - nothing to eat or drink after midnight the night before except water.   Testing/Procedures: None Ordered   Follow-Up: Your physician wants you to follow-up in: 1 year with Dr. Nahser.  You will receive a reminder letter in the mail two months in advance. If you don't receive a letter, please call our office to schedule the follow-up appointment.   If you need a refill on your cardiac medications before your next appointment, please call your pharmacy.   Thank you for choosing [facility]! Marrian Bells, RN [phone]    

## 2015-09-21 NOTE — Progress Notes (Signed)
Darin Pitts Date of Birth  1956/03/14       [facility] [address] [place], [place]  [address] [phone]   Fax  [phone]   [facility] [address] [place], [place]  [address] [phone]  Fax [phone]  Problem List: 1. Coronary artery disease-status post CABG by Dr. Liptak Moraswen in November, 2010 2. Diabetes Mellitus 3. Hyperlipidemia  History of Present Illness:  Darin Pitts is a 59 y.o. gentleman with a history of coronary artery disease. He has not had any difficulty since his bypass grafting in 2010. 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  He tries to eat a low fat diet.  Nov. 3, 2014:  Darin Pitts has been seen by Lawson FiscalLori since I last saw him.   No complaints.  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    He is exercising regularly.   Nov. 9, 2015:  Darin Pitts is doing well.  No angina.  Working out regularly. No issues. Doing well  is fasting for lab work  Nov. 14, 2016:   Doing great . Works out regularly , no CP      Current Outpatient Prescriptions on File Prior to Visit  Medication Sig Dispense Refill  . aspirin EC 81 MG tablet Take 1 tablet (81 mg total) by mouth daily.    Marland Kitchen. atorvastatin (LIPITOR) 20 MG tablet Take 1 tablet (20 mg total) by mouth daily.    . metoprolol tartrate (LOPRESSOR) 25 MG tablet TAKE 1 TABLET (25 MG TOTAL) BY MOUTH 2 (TWO) TIMES DAILY. 60 tablet 11  . Multiple Vitamin (MULTIVITAMIN) tablet Take 1 tablet by mouth daily.      . Saxagliptin-Metformin (KOMBIGLYZE XR) 5-500 MG TB24 Take 1 tablet by mouth daily. 30 tablet    No current facility-administered medications on file prior to visit.    No Known Allergies  Past Medical History  Diagnosis Date  . CAD (coronary artery disease) 2010    CABG x 1  DR. OWEN  . Hyperglycemia     Past Surgical History  Procedure Laterality Date  . Hemorrhoid surgery       History  Smoking status  . Never Smoker   Smokeless tobacco  . Not on file    History  Alcohol Use No    Family History  Problem Relation Age of Onset  . Hypertension Father   . Diabetes Father   . Hypertension Mother   . Stroke Mother   . Diabetes Mother   . Alzheimer's disease Mother   . Hypertension Brother   . Diabetes Brother     Reviw of Systems:  Reviewed in the HPI.  All other systems are negative.  Physical Exam: Blood pressure 94/70, pulse 79, height 5\' 10"  (1.778 m), weight 214 lb (97.07 kg). General: Well developed, well nourished, in no acute distress.  Head: Normocephalic, atraumatic, sclera non-icteric, mucus membranes are moist,   Neck: Supple. Carotids are 2 + without bruits. No JVD  Lungs: Clear bilaterally to auscultation.  Heart: regular rate.  normal  S1 S2. No murmurs, gallops or rubs.  His sternotomy scar is well healed.  Abdomen: Soft, non-tender,  normal bowel sounds. No hepatomegaly. No rebound/guarding. No masses.  Msk:  Strength and tone are normal  Extremities: No clubbing or cyanosis. No edema.  Distal pedal pulses are 2+     Neuro: Alert  and oriented X 3. Moves all extremities spontaneously.  Psych:  Responds to questions appropriately with a normal affect.  ECG: 09/21/2015: Normal sinus rhythm at 79. He has no ST or T wave changes.   Assessment / Plan:    1. Coronary artery disease-status post CABG by Dr. Ranta Moras in November, 2010-   Doing well.  No angina   2. Diabetes Mellitus - followed by his medical doctor   3. Hyperlipidemia- fasting labs today    Dabney Schanz, Deloris Ping, MD  09/21/2015 8:56 AM    [facility] [address] [place],  [address] [place], [place]  [address] Pager [phone] Phone: [phone]; Fax: [phone]   [facility]  [address] [place], [place]  [phone]   Fax [phone]

## 2015-10-05 ENCOUNTER — Other Ambulatory Visit: Payer: Self-pay | Admitting: Cardiovascular Disease

## 2015-12-14 ENCOUNTER — Encounter: Payer: Self-pay | Admitting: Cardiovascular Disease

## 2015-12-15 ENCOUNTER — Telehealth: Payer: Self-pay | Admitting: Cardiovascular Disease

## 2015-12-15 NOTE — Telephone Encounter (Signed)
Walk in pt form-pt needs clearance to Drive-gave to [facility]

## 2015-12-16 ENCOUNTER — Telehealth: Payer: Self-pay | Admitting: Nurse Practitioner

## 2015-12-16 DIAGNOSIS — I2510 Atherosclerotic heart disease of native coronary artery without angina pectoris: Secondary | ICD-10-CM

## 2015-12-16 NOTE — Telephone Encounter (Signed)
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  I called [facility] and spoke with the occupational health nurse, Patsy Lager, who advised that EF has to be from test within last 2 years.  I left a message for patient advising him that he will need to call us to schedule an echocardiogram.

## 2015-12-18 NOTE — Telephone Encounter (Signed)
Spoke with patient who has echo scheduled for 2/16.  I advised that once results are reviewed by Dr. Elease Hashimoto, I will fax paperwork to DOT.  He verbalized understanding.

## 2015-12-24 ENCOUNTER — Encounter: Payer: Self-pay | Admitting: Nurse Practitioner

## 2015-12-24 ENCOUNTER — Ambulatory Visit (HOSPITAL_COMMUNITY): Payer: 59 | Attending: Internal Medicine

## 2015-12-24 ENCOUNTER — Other Ambulatory Visit: Payer: Self-pay

## 2015-12-24 DIAGNOSIS — Z951 Presence of aortocoronary bypass graft: Secondary | ICD-10-CM | POA: Diagnosis not present

## 2015-12-24 DIAGNOSIS — E119 Type 2 diabetes mellitus without complications: Secondary | ICD-10-CM | POA: Diagnosis not present

## 2015-12-24 DIAGNOSIS — I071 Rheumatic tricuspid insufficiency: Secondary | ICD-10-CM | POA: Insufficient documentation

## 2015-12-24 DIAGNOSIS — I358 Other nonrheumatic aortic valve disorders: Secondary | ICD-10-CM | POA: Insufficient documentation

## 2015-12-24 DIAGNOSIS — E785 Hyperlipidemia, unspecified: Secondary | ICD-10-CM | POA: Insufficient documentation

## 2015-12-24 DIAGNOSIS — I517 Cardiomegaly: Secondary | ICD-10-CM | POA: Diagnosis not present

## 2015-12-24 DIAGNOSIS — I351 Nonrheumatic aortic (valve) insufficiency: Secondary | ICD-10-CM | POA: Diagnosis not present

## 2015-12-24 DIAGNOSIS — I2510 Atherosclerotic heart disease of native coronary artery without angina pectoris: Secondary | ICD-10-CM | POA: Diagnosis not present

## 2015-12-30 NOTE — Telephone Encounter (Signed)
Letter of clearance sent on 2/16

## 2016-05-09 ENCOUNTER — Encounter: Payer: Self-pay | Admitting: Cardiovascular Disease

## 2016-08-15 ENCOUNTER — Encounter: Payer: Self-pay | Admitting: Cardiovascular Disease

## 2016-09-26 ENCOUNTER — Encounter: Payer: Self-pay | Admitting: Cardiovascular Disease

## 2016-09-26 ENCOUNTER — Ambulatory Visit (INDEPENDENT_AMBULATORY_CARE_PROVIDER_SITE_OTHER): Payer: 59 | Admitting: Cardiovascular Disease

## 2016-09-26 ENCOUNTER — Encounter (INDEPENDENT_AMBULATORY_CARE_PROVIDER_SITE_OTHER): Payer: Self-pay

## 2016-09-26 VITALS — BP 120/70 | HR 68 | Ht 70.0 in | Wt 216.8 lb

## 2016-09-26 DIAGNOSIS — I25810 Atherosclerosis of coronary artery bypass graft(s) without angina pectoris: Secondary | ICD-10-CM

## 2016-09-26 DIAGNOSIS — E782 Mixed hyperlipidemia: Secondary | ICD-10-CM

## 2016-09-26 DIAGNOSIS — I2510 Atherosclerotic heart disease of native coronary artery without angina pectoris: Secondary | ICD-10-CM

## 2016-09-26 NOTE — Patient Instructions (Signed)
Medication Instructions:  Your physician recommends that you continue on your current medications as directed. Please refer to the Current Medication list given to you today.   Labwork: None Ordered   Testing/Procedures: None Ordered   Follow-Up: Your physician wants you to follow-up in: 1 year with Dr. Nahser.  You will receive a reminder letter in the mail two months in advance. If you don't receive a letter, please call our office to schedule the follow-up appointment.   If you need a refill on your cardiac medications before your next appointment, please call your pharmacy.   Thank you for choosing [facility]! Dandrea Widdowson, RN [phone]    

## 2016-09-26 NOTE — Progress Notes (Signed)
Darin Pitts N [place] Date of Birth  Nov 15, 1955       [facility] [address] [place], [place]  [address] [phone]   Fax  [phone]    Problem List: 1. Coronary artery disease-status post CABG by Dr. Reddix Moraswen in November, 2010 2. Diabetes Mellitus 3. Hyperlipidemia    [name] is a 60 y.o. gentleman with a history of coronary artery disease. He has not had any difficulty since his bypass grafting in 2010. 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  He tries to eat a low fat diet.  Nov. 3, 2014:  [name] has been seen by Lawson FiscalLori since I last saw him.   No complaints.  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    He is exercising regularly.   Nov. 9, 2015:  [name] is doing well.  No angina.  Working out regularly. No issues. Doing well  is fasting for lab work  Nov. 14, 2016:   Doing great . Works out regularly , no CP    Nov. 20, 2017:  Darin Pitts is seen for follow up visit . Working out regularly. No CP or dyspnea.   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   Current Outpatient Prescriptions on File Prior to Visit  Medication Sig Dispense Refill  . aspirin EC 81 MG tablet Take 1 tablet (81 mg total) by mouth daily.    Marland Kitchen. atorvastatin (LIPITOR) 20 MG tablet Take 1 tablet (20 mg total) by mouth daily.    . metoprolol tartrate (LOPRESSOR) 25 MG tablet TAKE 1 TABLET (25 MG TOTAL) BY MOUTH 2 (TWO) TIMES DAILY. 60 tablet 11  . Multiple Vitamin (MULTIVITAMIN) tablet Take 1 tablet by mouth daily.      . Saxagliptin-Metformin (KOMBIGLYZE XR) 5-500 MG TB24 Take 1 tablet by mouth daily. 30 tablet    No current facility-administered medications on file prior to visit.     No Known Allergies  Past Medical History:  Diagnosis Date  . CAD (coronary artery disease) 2010   CABG x 1  DR. OWEN  . Hyperglycemia     Past Surgical History:  Procedure Laterality Date  . HEMORRHOID  SURGERY      History  Smoking Status  . Never Smoker  Smokeless Tobacco  . Not on file    History  Alcohol Use No    Family History  Problem Relation Age of Onset  . Hypertension Father   . Diabetes Father   . Hypertension Mother   . Stroke Mother   . Diabetes Mother   . Alzheimer's disease Mother   . Hypertension Brother   . Diabetes Brother     Reviw of Systems:  Reviewed in the HPI.  All other systems are negative.  Physical Exam: Blood pressure 120/70, pulse 68, height 5\' 10"  (1.778 m), weight 216 lb 12.8 oz (98.3 kg), SpO2 90 %. General: Well developed, well nourished, in no acute distress.  Head: Normocephalic, atraumatic, sclera non-icteric, mucus membranes are moist,   Neck: Supple. Carotids are 2 + without bruits. No JVD  Lungs: Clear bilaterally to auscultation.  Heart: regular rate.  normal  S1 S2. No murmurs, gallops or rubs.  His sternotomy scar is well healed.  Abdomen: Soft, non-tender,  normal bowel sounds. No hepatomegaly. No rebound/guarding. No masses.  Msk:  Strength and tone are normal  Extremities: No clubbing  or cyanosis. No edema.  Distal pedal pulses are 2+     Neuro: Alert and oriented X 3. Moves all extremities spontaneously.  Psych:  Responds to questions appropriately with a normal affect.  ECG: Nov. 20, 2017:   Sinus rhythm at 68 with 1st degree AV block   Assessment / Plan:    1. Coronary artery disease-status post CABG by Dr. Hechavarria Moraswen in November, 2010-   Doing well.  No angina   2. Diabetes Mellitus - followed by his medical doctor   3. Hyperlipidemia- fasting labs today    Kristeen MissPhilip Nevaen Tredway, MD  09/26/2016 11:13 AM    [facility] [address]  [address] [place], [place]  [address] Pager [phone] Phone: [phone]; Fax: [phone]   [facility]  [address] [place], [place]  [address] [phone]   Fax [phone]

## 2016-10-12 ENCOUNTER — Other Ambulatory Visit: Payer: Self-pay | Admitting: Cardiovascular Disease

## 2016-11-22 ENCOUNTER — Telehealth: Payer: Self-pay | Admitting: Nurse Practitioner

## 2016-11-22 DIAGNOSIS — I2510 Atherosclerotic heart disease of native coronary artery without angina pectoris: Secondary | ICD-10-CM

## 2016-11-22 DIAGNOSIS — Z024 Encounter for examination for driving license: Secondary | ICD-10-CM

## 2016-11-22 DIAGNOSIS — Z951 Presence of aortocoronary bypass graft: Secondary | ICD-10-CM

## 2016-11-22 NOTE — Telephone Encounter (Signed)
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  I have placed an order for the echo and will ask our scheduler to contact patient. He was last seen by Dr. Elease HashimotoNahser in November 2017 and when echo is completed, I will attach last office visit note to echo report and send to [facility] at fax [phone]. Phone is [phone]

## 2016-11-25 ENCOUNTER — Telehealth: Payer: Self-pay | Admitting: Cardiovascular Disease

## 2016-11-25 NOTE — Telephone Encounter (Signed)
Spoke with pt and advised him echo would need to be completed prior to clearing for DOT (see previous phone note from [place] PocketMichelle).  Advised pt I would send message to echo scheduler and them contact pt for appt.  Pt appreciative for assistance.

## 2016-11-25 NOTE — Telephone Encounter (Signed)
New Message     Did you get his DOT paperwork completed. He said he dropped it off 11/21/16

## 2016-12-02 ENCOUNTER — Other Ambulatory Visit: Payer: Self-pay

## 2016-12-02 ENCOUNTER — Encounter (INDEPENDENT_AMBULATORY_CARE_PROVIDER_SITE_OTHER): Payer: Self-pay

## 2016-12-02 ENCOUNTER — Ambulatory Visit (HOSPITAL_COMMUNITY): Payer: 59 | Attending: Cardiovascular Disease

## 2016-12-02 DIAGNOSIS — I351 Nonrheumatic aortic (valve) insufficiency: Secondary | ICD-10-CM | POA: Insufficient documentation

## 2016-12-02 DIAGNOSIS — I2510 Atherosclerotic heart disease of native coronary artery without angina pectoris: Secondary | ICD-10-CM | POA: Diagnosis not present

## 2016-12-02 DIAGNOSIS — E785 Hyperlipidemia, unspecified: Secondary | ICD-10-CM | POA: Diagnosis not present

## 2016-12-02 DIAGNOSIS — E119 Type 2 diabetes mellitus without complications: Secondary | ICD-10-CM | POA: Diagnosis not present

## 2016-12-02 DIAGNOSIS — Z024 Encounter for examination for driving license: Secondary | ICD-10-CM | POA: Insufficient documentation

## 2016-12-02 DIAGNOSIS — Z951 Presence of aortocoronary bypass graft: Secondary | ICD-10-CM | POA: Diagnosis not present

## 2016-12-05 ENCOUNTER — Encounter: Payer: Self-pay | Admitting: Nurse Practitioner

## 2016-12-05 ENCOUNTER — Telehealth: Payer: Self-pay | Admitting: Cardiovascular Disease

## 2016-12-05 NOTE — Telephone Encounter (Signed)
See telephone encounter dated 12/05/16

## 2016-12-05 NOTE — Telephone Encounter (Signed)
Spoke with patient and reviewed echo results. I advised that I will be faxing paperwork to [facility] today for his DOT paperwork. He verbalized understanding and thanked me for the call.

## 2016-12-05 NOTE — Telephone Encounter (Signed)
F/u Message  Pt call requesting to speak with RN about DOT paperwork. Please call back to discuss

## 2016-12-05 NOTE — Telephone Encounter (Signed)
New message       Talk to the nurse regarding getting a letter for DOT sent to his PCP.  DOT card will expire in 2 days.  Please call

## 2016-12-12 DIAGNOSIS — E1165 Type 2 diabetes mellitus with hyperglycemia: Secondary | ICD-10-CM | POA: Diagnosis not present

## 2016-12-12 DIAGNOSIS — I2510 Atherosclerotic heart disease of native coronary artery without angina pectoris: Secondary | ICD-10-CM | POA: Diagnosis not present

## 2016-12-12 DIAGNOSIS — I119 Hypertensive heart disease without heart failure: Secondary | ICD-10-CM | POA: Diagnosis not present

## 2017-03-27 DIAGNOSIS — N400 Enlarged prostate without lower urinary tract symptoms: Secondary | ICD-10-CM | POA: Diagnosis not present

## 2017-04-17 DIAGNOSIS — I119 Hypertensive heart disease without heart failure: Secondary | ICD-10-CM | POA: Diagnosis not present

## 2017-04-17 DIAGNOSIS — I2510 Atherosclerotic heart disease of native coronary artery without angina pectoris: Secondary | ICD-10-CM | POA: Diagnosis not present

## 2017-04-17 DIAGNOSIS — E1165 Type 2 diabetes mellitus with hyperglycemia: Secondary | ICD-10-CM | POA: Diagnosis not present

## 2017-05-01 DIAGNOSIS — M9913 Subluxation complex (vertebral) of lumbar region: Secondary | ICD-10-CM | POA: Diagnosis not present

## 2017-08-07 DIAGNOSIS — E119 Type 2 diabetes mellitus without complications: Secondary | ICD-10-CM | POA: Diagnosis not present

## 2017-08-28 DIAGNOSIS — E1165 Type 2 diabetes mellitus with hyperglycemia: Secondary | ICD-10-CM | POA: Diagnosis not present

## 2017-08-28 DIAGNOSIS — Z0000 Encounter for general adult medical examination without abnormal findings: Secondary | ICD-10-CM | POA: Diagnosis not present

## 2017-08-28 DIAGNOSIS — I119 Hypertensive heart disease without heart failure: Secondary | ICD-10-CM | POA: Diagnosis not present

## 2017-09-04 DIAGNOSIS — Z23 Encounter for immunization: Secondary | ICD-10-CM | POA: Diagnosis not present

## 2017-10-16 ENCOUNTER — Ambulatory Visit: Payer: 59 | Admitting: Cardiovascular Disease

## 2017-10-18 ENCOUNTER — Encounter: Payer: Self-pay | Admitting: Cardiovascular Disease

## 2017-10-18 ENCOUNTER — Ambulatory Visit: Payer: 59 | Admitting: Cardiovascular Disease

## 2017-10-18 ENCOUNTER — Encounter (INDEPENDENT_AMBULATORY_CARE_PROVIDER_SITE_OTHER): Payer: Self-pay

## 2017-10-18 VITALS — BP 124/70 | HR 79 | Ht 70.0 in | Wt 223.0 lb

## 2017-10-18 DIAGNOSIS — I2510 Atherosclerotic heart disease of native coronary artery without angina pectoris: Secondary | ICD-10-CM

## 2017-10-18 DIAGNOSIS — E782 Mixed hyperlipidemia: Secondary | ICD-10-CM

## 2017-10-18 NOTE — Progress Notes (Signed)
Darin Pitts Date of Birth  February 14, 1956       [facility] [address] [place], [place]  [address] [phone]   Fax  [phone]    Problem List: 1. Coronary artery disease-status post CABG by Dr. Sugrue Moraswen in November, 2010 2. Diabetes Mellitus 3. Hyperlipidemia    Darin Pitts is a 61 y.o. gentleman with a history of coronary artery disease. He has not had any difficulty since his bypass grafting in 2010. 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  He tries to eat a low fat diet.  Nov. 3, 2014:  Darin Pitts has been seen by Darin FiscalLori since I last saw him.   No complaints.  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    He is exercising regularly.   Nov. 9, 2015:  Darin Pitts is doing well.  No angina.  Working out regularly. No issues. Doing well  is fasting for lab work  Nov. 14, 2016:   Doing great . Works out regularly , no CP    Nov. 20, 2017:  Darin Pitts is seen for follow up visit . Working out regularly. No CP or dyspnea.   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   October 18, 2017:  Doing well .   Working out regularly .    Current Outpatient Medications on File Prior to Visit  Medication Sig Dispense Refill  . aspirin EC 81 MG tablet Take 1 tablet (81 mg total) by mouth daily.    Marland Kitchen. atorvastatin (LIPITOR) 20 MG tablet Take 1 tablet (20 mg total) by mouth daily.    . metoprolol tartrate (LOPRESSOR) 25 MG tablet TAKE 1 TABLET (25 MG TOTAL) BY MOUTH 2 (TWO) TIMES DAILY. 60 tablet 11  . Multiple Vitamin (MULTIVITAMIN) tablet Take 1 tablet by mouth daily.      . Saxagliptin-Metformin (KOMBIGLYZE XR) 5-500 MG TB24 Take 1 tablet by mouth daily. 30 tablet    No current facility-administered medications on file prior to visit.     No Known Allergies  Past Medical History:  Diagnosis Date  . CAD (coronary artery disease) 2010   CABG x 1  DR. OWEN  . Hyperglycemia     Past  Surgical History:  Procedure Laterality Date  . HEMORRHOID SURGERY      Social History   Tobacco Use  Smoking Status Never Smoker  Smokeless Tobacco Never Used    Social History   Substance and Sexual Activity  Alcohol Use No    Family History  Problem Relation Age of Onset  . Hypertension Father   . Diabetes Father   . Hypertension Mother   . Stroke Mother   . Diabetes Mother   . Alzheimer's disease Mother   . Hypertension Brother   . Diabetes Brother     Reviw of Systems:  Reviewed in the HPI.  All other systems are negative.  Physical Exam: Blood pressure 124/70, pulse 79, height 5\' 10"  (1.778 m), weight 223 lb (101.2 kg).  GEN:  Well nourished, well developed in no acute distress HEENT: Normal NECK: No JVD; No carotid bruits LYMPHATICS: No lymphadenopathy CARDIAC: RR, no murmurs, rubs, gallops RESPIRATORY:  Clear to auscultation without rales, wheezing or rhonchi  ABDOMEN: Soft, non-tender, non-distended MUSCULOSKELETAL:  No edema; No deformity  SKIN: Warm and dry NEUROLOGIC:  Alert and oriented x 3   ECG: October 18, 2017: Normal sinus rhythm at 79 beats a minute.  First-degree AV block.  Assessment / Plan:    1. Coronary artery disease- doing great . Continue current meds.  He works out on a regular basis.  He is not having any episodes of angina.  2. Diabetes Mellitus - followed by his medical doctor   3. Hyperlipidemia- lipids managed by Dr. Allyne Pitts. He will ask Dr. Allyne Pitts to forward his most recent labs to us.     Darin MissPhilip Avelynn Sellin, MD  10/18/2017 10:59 AM    [facility] [address]  [address] [place], [place]  [address] Pager [phone] Phone: [phone]; Fax: [phone]

## 2017-10-18 NOTE — Patient Instructions (Signed)
Medication Instructions:  Your physician recommends that you continue on your current medications as directed. Please refer to the Current Medication list given to you today.   Labwork: None Ordered   Testing/Procedures: None Ordered   Follow-Up: Your physician wants you to follow-up in: 1 year with Dr. Nahser.  You will receive a reminder letter in the mail two months in advance. If you don't receive a letter, please call our office to schedule the follow-up appointment.   If you need a refill on your cardiac medications before your next appointment, please call your pharmacy.   Thank you for choosing [facility]! Darelle Kings, RN [phone]    

## 2017-10-19 ENCOUNTER — Other Ambulatory Visit: Payer: Self-pay | Admitting: Cardiovascular Disease

## 2017-11-06 ENCOUNTER — Telehealth: Payer: Self-pay | Admitting: Cardiovascular Disease

## 2017-11-06 NOTE — Telephone Encounter (Signed)
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 Placed in Dr.Nasher Doc Box

## 2017-11-08 ENCOUNTER — Telehealth: Payer: Self-pay | Admitting: Cardiovascular Disease

## 2017-11-08 DIAGNOSIS — I2510 Atherosclerotic heart disease of native coronary artery without angina pectoris: Secondary | ICD-10-CM

## 2017-11-08 DIAGNOSIS — Z024 Encounter for examination for driving license: Secondary | ICD-10-CM

## 2017-11-08 NOTE — Telephone Encounter (Signed)
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 I was advised that a GXT is all that is needed (nuclear imaging is not required).  I called patient and scheduled him for GXT on Friday 1/4. I advised him to remain NPO for 4 hours before the test and to hold metoprolol for 24 hours. He verbalized understanding and agreement and thanked me for the call.

## 2017-11-08 NOTE — Telephone Encounter (Signed)
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 I am routing to Dr. Elease HashimotoNahser for agreement.

## 2017-11-08 NOTE — Telephone Encounter (Signed)
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

## 2017-11-08 NOTE — Telephone Encounter (Signed)
New message  Pt verbalized that he is calling for the rn  [facility] have sent the DOT form for Dr.Nasher and she is also saying that he needs a stress test

## 2017-11-10 ENCOUNTER — Ambulatory Visit (INDEPENDENT_AMBULATORY_CARE_PROVIDER_SITE_OTHER): Payer: 59

## 2017-11-10 DIAGNOSIS — Z024 Encounter for examination for driving license: Secondary | ICD-10-CM

## 2017-11-10 DIAGNOSIS — I2510 Atherosclerotic heart disease of native coronary artery without angina pectoris: Secondary | ICD-10-CM

## 2017-11-10 LAB — EXERCISE TOLERANCE TEST
Estimated workload: 8.5 METS
Exercise duration (min): 7 min
Exercise duration (sec): 0 s
MPHR: 158 {beats}/min
Peak HR: 169 {beats}/min
Percent HR: 106 %
RPE: 17
Rest HR: 85 {beats}/min

## 2017-11-13 ENCOUNTER — Encounter: Payer: Self-pay | Admitting: Nurse Practitioner

## 2017-12-11 DIAGNOSIS — M62830 Muscle spasm of back: Secondary | ICD-10-CM | POA: Diagnosis not present

## 2017-12-11 DIAGNOSIS — M9903 Segmental and somatic dysfunction of lumbar region: Secondary | ICD-10-CM | POA: Diagnosis not present

## 2018-01-01 DIAGNOSIS — I2510 Atherosclerotic heart disease of native coronary artery without angina pectoris: Secondary | ICD-10-CM | POA: Diagnosis not present

## 2018-01-01 DIAGNOSIS — I119 Hypertensive heart disease without heart failure: Secondary | ICD-10-CM | POA: Diagnosis not present

## 2018-01-01 DIAGNOSIS — E1165 Type 2 diabetes mellitus with hyperglycemia: Secondary | ICD-10-CM | POA: Diagnosis not present

## 2018-04-15 DIAGNOSIS — I10 Essential (primary) hypertension: Secondary | ICD-10-CM | POA: Insufficient documentation

## 2018-04-15 DIAGNOSIS — R451 Restlessness and agitation: Secondary | ICD-10-CM | POA: Diagnosis not present

## 2018-04-15 DIAGNOSIS — E1169 Type 2 diabetes mellitus with other specified complication: Secondary | ICD-10-CM | POA: Insufficient documentation

## 2018-04-15 DIAGNOSIS — E119 Type 2 diabetes mellitus without complications: Secondary | ICD-10-CM | POA: Insufficient documentation

## 2018-04-15 DIAGNOSIS — I2510 Atherosclerotic heart disease of native coronary artery without angina pectoris: Secondary | ICD-10-CM | POA: Insufficient documentation

## 2018-04-15 DIAGNOSIS — F419 Anxiety disorder, unspecified: Secondary | ICD-10-CM | POA: Insufficient documentation

## 2018-04-30 DIAGNOSIS — I2510 Atherosclerotic heart disease of native coronary artery without angina pectoris: Secondary | ICD-10-CM | POA: Diagnosis not present

## 2018-04-30 DIAGNOSIS — E1169 Type 2 diabetes mellitus with other specified complication: Secondary | ICD-10-CM | POA: Diagnosis not present

## 2018-04-30 DIAGNOSIS — I119 Hypertensive heart disease without heart failure: Secondary | ICD-10-CM | POA: Diagnosis not present

## 2018-07-30 DIAGNOSIS — Z7982 Long term (current) use of aspirin: Secondary | ICD-10-CM

## 2018-07-30 DIAGNOSIS — Z79899 Other long term (current) drug therapy: Secondary | ICD-10-CM | POA: Diagnosis not present

## 2018-07-30 DIAGNOSIS — I119 Hypertensive heart disease without heart failure: Secondary | ICD-10-CM | POA: Diagnosis not present

## 2018-07-30 DIAGNOSIS — I2510 Atherosclerotic heart disease of native coronary artery without angina pectoris: Secondary | ICD-10-CM | POA: Diagnosis not present

## 2018-07-30 DIAGNOSIS — E1169 Type 2 diabetes mellitus with other specified complication: Secondary | ICD-10-CM | POA: Diagnosis not present

## 2018-10-08 ENCOUNTER — Other Ambulatory Visit: Payer: Self-pay | Admitting: Cardiovascular Disease

## 2018-10-10 DIAGNOSIS — Z23 Encounter for immunization: Secondary | ICD-10-CM | POA: Diagnosis not present

## 2018-10-15 ENCOUNTER — Encounter: Payer: Self-pay | Admitting: Internal Medicine

## 2018-10-15 ENCOUNTER — Ambulatory Visit: Payer: 59 | Admitting: Internal Medicine

## 2018-10-15 VITALS — BP 114/68 | HR 85 | Temp 97.8°F | Ht 68.5 in | Wt 215.0 lb

## 2018-10-15 DIAGNOSIS — Z0000 Encounter for general adult medical examination without abnormal findings: Secondary | ICD-10-CM | POA: Diagnosis not present

## 2018-10-15 DIAGNOSIS — I119 Hypertensive heart disease without heart failure: Secondary | ICD-10-CM | POA: Diagnosis not present

## 2018-10-15 DIAGNOSIS — M65311 Trigger thumb, right thumb: Secondary | ICD-10-CM | POA: Diagnosis not present

## 2018-10-15 DIAGNOSIS — E785 Hyperlipidemia, unspecified: Secondary | ICD-10-CM

## 2018-10-15 DIAGNOSIS — E1169 Type 2 diabetes mellitus with other specified complication: Secondary | ICD-10-CM

## 2018-10-15 DIAGNOSIS — I25810 Atherosclerosis of coronary artery bypass graft(s) without angina pectoris: Secondary | ICD-10-CM

## 2018-10-15 DIAGNOSIS — Z1212 Encounter for screening for malignant neoplasm of rectum: Secondary | ICD-10-CM

## 2018-10-15 DIAGNOSIS — Z7982 Long term (current) use of aspirin: Secondary | ICD-10-CM

## 2018-10-15 LAB — POCT URINALYSIS DIPSTICK
Bilirubin, UA: NEGATIVE
Blood, UA: NEGATIVE
Glucose, UA: NEGATIVE
Ketones, UA: NEGATIVE
Leukocytes, UA: NEGATIVE
Nitrite, UA: NEGATIVE
Protein, UA: NEGATIVE
Spec Grav, UA: 1.025 (ref 1.010–1.025)
UROBILINOGEN UA: 0.2 U/dL
pH, UA: 5.5 (ref 5.0–8.0)

## 2018-10-15 LAB — POCT UA - MICROALBUMIN
Albumin/Creatinine Ratio, Urine, POC: 30
Creatinine, POC: 200 mg/dL
Microalbumin Ur, POC: 10 mg/L

## 2018-10-15 NOTE — Patient Instructions (Signed)
Exercising to Stay Healthy Exercising regularly is important. It has many health benefits, such as:  Improving your overall fitness, flexibility, and endurance.  Increasing your bone density.  Helping with weight control.  Decreasing your body fat.  Increasing your muscle strength.  Reducing stress and tension.  Improving your overall health.  In order to become healthy and stay healthy, it is recommended that you do moderate-intensity and vigorous-intensity exercise. You can tell that you are exercising at a moderate intensity if you have a higher heart rate and faster breathing, but you are still able to hold a conversation. You can tell that you are exercising at a vigorous intensity if you are breathing much harder and faster and cannot hold a conversation while exercising. How often should I exercise? Choose an activity that you enjoy and set realistic goals. Your health care provider can help you to make an activity plan that works for you. Exercise regularly as directed by your health care provider. This may include:  Doing resistance training twice each week, such as: ? Push-ups. ? Sit-ups. ? Lifting weights. ? Using resistance bands.  Doing a given intensity of exercise for a given amount of time. Choose from these options: ? 150 minutes of moderate-intensity exercise every week. ? 75 minutes of vigorous-intensity exercise every week. ? A mix of moderate-intensity and vigorous-intensity exercise every week.  Children, pregnant women, people who are out of shape, people who are overweight, and older adults may need to consult a health care provider for individual recommendations. If you have any sort of medical condition, be sure to consult your health care provider before starting a new exercise program. What are some exercise ideas? Some moderate-intensity exercise ideas include:  Walking at a rate of 1 mile in 15  minutes.  Biking.  Hiking.  Golfing.  Dancing.  Some vigorous-intensity exercise ideas include:  Walking at a rate of at least 4.5 miles per hour.  Jogging or running at a rate of 5 miles per hour.  Biking at a rate of at least 10 miles per hour.  Lap swimming.  Roller-skating or in-line skating.  Cross-country skiing.  Vigorous competitive sports, such as football, basketball, and soccer.  Jumping rope.  Aerobic dancing.  What are some everyday activities that can help me to get exercise?  Yard work, such as: ? Pushing a lawn mower. ? Raking and bagging leaves.  Washing and waxing your car.  Pushing a stroller.  Shoveling snow.  Gardening.  Washing windows or floors. How can I be more active in my day-to-day activities?  Use the stairs instead of the elevator.  Take a walk during your lunch break.  If you drive, park your car farther away from work or school.  If you take public transportation, get off one stop early and walk the rest of the way.  Make all of your phone calls while standing up and walking around.  Get up, stretch, and walk around every 30 minutes throughout the day. What guidelines should I follow while exercising?  Do not exercise so much that you hurt yourself, feel dizzy, or get very short of breath.  Consult your health care provider before starting a new exercise program.  Wear comfortable clothes and shoes with good support.  Drink plenty of water while you exercise to prevent dehydration or heat stroke. Body water is lost during exercise and must be replaced.  Work out until you breathe faster and your heart beats faster. This information is not   intended to replace advice given to you by your health care provider. Make sure you discuss any questions you have with your health care provider. Document Released: 11/26/2010 Document Revised: 03/31/2016 Document Reviewed: 03/27/2014 Elsevier Interactive Patient Education  2018  [employer]  

## 2018-10-15 NOTE — Progress Notes (Signed)
Subjective:     Patient ID: Darin Pitts , male    DOB: [birth year] , 62 y.o.   MRN: [id number]   Chief Complaint  Patient presents with  . Annual Exam  . Diabetes  . Hypertension    HPI  He is here today for a full physical exam. He is not followed by Urology. He has no specific concerns or complaints at this time.   Diabetes  He presents for his follow-up diabetic visit. He has type 2 diabetes mellitus. His disease course has been improving. There are no hypoglycemic associated symptoms. There are no diabetic associated symptoms. There are no hypoglycemic complications. Diabetic complications include heart disease. Risk factors for coronary artery disease include diabetes mellitus, dyslipidemia, hypertension and male sex. He is compliant with treatment most of the time. He is following a diabetic diet. He participates in exercise three times a week. Eye exam is current.  Hypertension  This is a chronic problem. The current episode started more than 1 year ago. The problem has been gradually improving since onset. The problem is controlled.   He reports compliance with meds.   Past Medical History:  Diagnosis Date  . CAD (coronary artery disease) 2010   CABG x 1  DR. OWEN  . Hyperglycemia      Family History  Problem Relation Age of Onset  . Hypertension Father   . Diabetes Father   . Hypertension Mother   . Stroke Mother   . Diabetes Mother   . Alzheimer's disease Mother   . Hypertension Brother   . Diabetes Brother      Current Outpatient Medications:  .  aspirin EC 81 MG tablet, Take 1 tablet (81 mg total) by mouth daily., Disp: , Rfl:  .  atorvastatin (LIPITOR) 20 MG tablet, Take 1 tablet (20 mg total) by mouth daily., Disp: , Rfl:  .  metoprolol tartrate (LOPRESSOR) 25 MG tablet, TAKE 1 TABLET (25 MG TOTAL) BY MOUTH 2 (TWO) TIMES DAILY., Disp: 60 tablet, Rfl: 0 .  Multiple Vitamin (MULTIVITAMIN) tablet, Take 1 tablet by mouth daily.  , Disp: , Rfl:  .   Saxagliptin-Metformin (KOMBIGLYZE XR) 5-500 MG TB24, Take 1 tablet by mouth daily., Disp: 30 tablet, Rfl:    No Known Allergies   Men's preventive visit. Patient Health Questionnaire (PHQ-2) is    Office Visit from 10/15/2018 in [facility]  PHQ-2 Total Score  0    . Patient is on a diabetic diet. Marital status: Married. Relevant history for alcohol use is:  Social History   Substance and Sexual Activity  Alcohol Use No  . Relevant history for tobacco use is:  Social History   Tobacco Use  Smoking Status Never Smoker  Smokeless Tobacco Never Used  .  Review of Systems  Constitutional: Negative.   HENT: Negative.   Eyes: Negative.   Respiratory: Negative.   Cardiovascular: Negative.   Gastrointestinal: Negative.   Endocrine: Negative.   Genitourinary: Negative.   Musculoskeletal: Positive for arthralgias (he c/o r thumb pain, intermittent. feels like it gets stuck. he denies trauma/fall. ).  Skin: Negative.   Allergic/Immunologic: Negative.   Neurological: Negative.   Hematological: Negative.   Psychiatric/Behavioral: Negative.      Today's Vitals   10/15/18 1049  BP: 114/68  Pulse: 85  Temp: 97.8 F (36.6 C)  TempSrc: Oral  Weight: 215 lb (97.5 kg)  Height: 5' 8.5" (1.74 m)   Body mass index is 32.22 kg/m.  Objective:  Physical Exam  Constitutional: He is oriented to person, place, and time. He appears well-developed and well-nourished.  HENT:  Head: Normocephalic and atraumatic.  Right Ear: External ear normal.  Left Ear: External ear normal.  Nose: Nose normal.  Mouth/Throat: Oropharynx is clear and moist.  Eyes: Pupils are equal, round, and reactive to light. Conjunctivae and EOM are normal.  Neck: Normal range of motion. Neck supple.  Cardiovascular: Normal rate, regular rhythm, normal heart sounds and intact distal pulses.  Pulmonary/Chest: Effort normal and breath sounds normal.  Abdominal: Soft. Bowel sounds are normal.   Genitourinary: Rectum normal and prostate normal.  Musculoskeletal: Normal range of motion.  There is tenderness to palpation of R 1st IP joint. No overlying erythema.  Feet:  Right Foot:  Protective Sensation: 5 sites tested. 5 sites sensed.  Skin Integrity: Negative for ulcer, blister, skin breakdown or erythema.  Left Foot:  Protective Sensation: 5 sites tested. 5 sites sensed.  Skin Integrity: Negative for ulcer, blister, skin breakdown or erythema.  Neurological: He is alert and oriented to person, place, and time.  Skin: Skin is warm and dry.  Psychiatric: He has a normal mood and affect.  Nursing note and vitals reviewed.       Assessment And Plan:     1. Routine general medical examination at health care facility  A full exam was performed.  DRE performed, stool is heme negative.  PATIENT HAS BEEN ADVISED TO GET 30-45 MINUTES REGULAR EXERCISE NO LESS THAN FOUR TO FIVE DAYS PER WEEK - BOTH WEIGHTBEARING EXERCISES AND AEROBIC ARE RECOMMENDED.  HE IS ADVISED TO FOLLOW A HEALTHY DIET WITH AT LEAST SIX FRUITS/VEGGIES PER DAY, DECREASE INTAKE OF RED MEAT, AND TO INCREASE FISH INTAKE TO TWO DAYS PER WEEK.  MEATS/FISH SHOULD NOT BE FRIED, BAKED OR BROILED IS PREFERABLE.  I SUGGEST WEARING SPF 50 SUNSCREEN ON EXPOSED PARTS AND ESPECIALLY WHEN IN THE DIRECT SUNLIGHT FOR AN EXTENDED PERIOD OF TIME.  PLEASE AVOID FAST FOOD RESTAURANTS AND INCREASE YOUR WATER INTAKE.  - CMP14+EGFR - CBC - Lipid panel - Hemoglobin A1c - EKG 12-Lead - TSH  2. Type 2 diabetes mellitus with hyperlipidemia (HCC)  Diabetic foot exam was performed. I DISCUSSED WITH THE PATIENT AT LENGTH REGARDING THE GOALS OF GLYCEMIC CONTROL AND POSSIBLE LONG-TERM COMPLICATIONS.  I  ALSO STRESSED THE IMPORTANCE OF COMPLIANCE WITH HOME GLUCOSE MONITORING, DIETARY RESTRICTIONS INCLUDING AVOIDANCE OF SUGARY DRINKS/PROCESSED FOODS,  ALONG WITH REGULAR EXERCISE.  I  ALSO STRESSED THE IMPORTANCE OF ANNUAL EYE EXAMS, SELF FOOT CARE  AND COMPLIANCE WITH OFFICE VISITS.  3. Benign hypertensive heart disease without heart failure  Well controlled. He will continue with current meds. He is encouraged to avoid adding salt to his foods. He is also followed by cardiology.   - EKG 12-Lead - POCT Urinalysis Dipstick (81002) - POCT UA - Microalbumin  4. Coronary artery disease involving coronary bypass graft of native heart without angina pectoris  Chronic, yet stable. He is currently asymptomatic.   5. Trigger thumb, right thumb  I will refer him to hand specialist for further evaluation.   Maximino Greenland, MD

## 2018-10-16 LAB — CBC
Hematocrit: 44 % (ref 37.5–51.0)
Hemoglobin: 14.4 g/dL (ref 13.0–17.7)
MCH: 28.9 pg (ref 26.6–33.0)
MCHC: 32.7 g/dL (ref 31.5–35.7)
MCV: 88 fL (ref 79–97)
Platelets: 210 10*3/uL (ref 150–450)
RBC: 4.99 x10E6/uL (ref 4.14–5.80)
RDW: 12.9 % (ref 12.3–15.4)
WBC: 7.1 10*3/uL (ref 3.4–10.8)

## 2018-10-16 LAB — CMP14+EGFR
ALBUMIN: 4.6 g/dL (ref 3.6–4.8)
ALT: 38 IU/L (ref 0–44)
AST: 29 IU/L (ref 0–40)
Albumin/Globulin Ratio: 2.1 (ref 1.2–2.2)
Alkaline Phosphatase: 44 IU/L (ref 39–117)
BUN / CREAT RATIO: 11 (ref 10–24)
BUN: 12 mg/dL (ref 8–27)
Bilirubin Total: 0.4 mg/dL (ref 0.0–1.2)
CO2: 20 mmol/L (ref 20–29)
Calcium: 10 mg/dL (ref 8.6–10.2)
Chloride: 105 mmol/L (ref 96–106)
Creatinine, Ser: 1.07 mg/dL (ref 0.76–1.27)
GFR calc Af Amer: 86 mL/min/{1.73_m2} (ref >59)
GFR, EST NON AFRICAN AMERICAN: 74 mL/min/{1.73_m2} (ref >59)
GLOBULIN, TOTAL: 2.2 g/dL (ref 1.5–4.5)
GLUCOSE: 114 mg/dL — AB (ref 65–99)
Potassium: 4.5 mmol/L (ref 3.5–5.2)
SODIUM: 141 mmol/L (ref 134–144)
TOTAL PROTEIN: 6.8 g/dL (ref 6.0–8.5)

## 2018-10-16 LAB — LIPID PANEL
Chol/HDL Ratio: 2.2 ratio (ref 0.0–5.0)
Cholesterol, Total: 120 mg/dL (ref 100–199)
HDL: 55 mg/dL (ref >39)
LDL Calculated: 52 mg/dL (ref 0–99)
Triglycerides: 64 mg/dL (ref 0–149)
VLDL Cholesterol Cal: 13 mg/dL (ref 5–40)

## 2018-10-16 LAB — TSH: TSH: 1.06 u[IU]/mL (ref 0.450–4.500)

## 2018-10-16 LAB — HEMOGLOBIN A1C
Est. average glucose Bld gHb Est-mCnc: 143 mg/dL
Hgb A1c MFr Bld: 6.6 % — ABNORMAL HIGH (ref 4.8–5.6)

## 2018-10-16 NOTE — Progress Notes (Signed)
Your liver and kidney function are normal. Your blood count is normal. . Your chol is great. a1c is 6.6, this is pretty good. Continue with current meds. Your thyroid fxn is nl.

## 2018-10-22 ENCOUNTER — Encounter: Payer: Self-pay | Admitting: Cardiovascular Disease

## 2018-10-22 ENCOUNTER — Ambulatory Visit: Payer: 59 | Admitting: Cardiovascular Disease

## 2018-10-22 VITALS — BP 116/82 | HR 71 | Ht 68.5 in | Wt 218.4 lb

## 2018-10-22 DIAGNOSIS — I2510 Atherosclerotic heart disease of native coronary artery without angina pectoris: Secondary | ICD-10-CM

## 2018-10-22 DIAGNOSIS — E782 Mixed hyperlipidemia: Secondary | ICD-10-CM | POA: Diagnosis not present

## 2018-10-22 DIAGNOSIS — Z951 Presence of aortocoronary bypass graft: Secondary | ICD-10-CM

## 2018-10-22 MED ORDER — METOPROLOL TARTRATE 25 MG PO TABS: 25.0000 mg | ORAL_TABLET | Freq: Two times a day (BID) | ORAL | 3 refills | Status: DC

## 2018-10-22 NOTE — Progress Notes (Signed)
Darin Pitts Date of Birth  12/05/1955       [facility] Office [address], [address] [place], [place]  [address] [phone]   Fax  [phone]    Problem List: 1. Coronary artery disease-status post CABG by Dr. Balboni Moras in November, 2010 2. Diabetes Mellitus 3. Hyperlipidemia    Darin Pitts is a 62 y.o. gentleman with a history of coronary artery disease. He has not had any difficulty since his bypass grafting in 2010. 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  He tries to eat a low fat diet.  Nov. 3, 2014:  Darin Pitts has been seen by Darin Pitts since I last saw him.   No complaints.  He still works as a Naval architect for [employer].    He is exercising regularly.   Nov. 9, 2015:  Darin Pitts is doing well.  No angina.  Working out regularly. No issues. Doing well  is fasting for lab work  Nov. 14, 2016:   Doing great . Works out regularly , no CP    Nov. 20, 2017:  Darin Pitts is seen for follow up visit . Working out regularly. No CP or dyspnea.   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   October 18, 2017:  Doing well .   Working out regularly .   October 22, 2018: Darin Pitts  is seen today for a follow-up visit. History of coronary artery disease and is status post coronary artery bypass grafting in 2010.  He has a history of hyperlipidemia. Works out regularly  Recent labs look great   Current Outpatient Medications on File Prior to Visit  Medication Sig Dispense Refill  . aspirin EC 81 MG tablet Take 1 tablet (81 mg total) by mouth daily.    Marland Kitchen atorvastatin (LIPITOR) 20 MG tablet Take 1 tablet (20 mg total) by mouth daily.    . metoprolol tartrate (LOPRESSOR) 25 MG tablet TAKE 1 TABLET (25 MG TOTAL) BY MOUTH 2 (TWO) TIMES DAILY. 60 tablet 0  . Multiple Vitamin (MULTIVITAMIN) tablet Take 1 tablet by mouth daily.      . Saxagliptin-Metformin (KOMBIGLYZE XR) 5-500 MG TB24 Take 1 tablet by mouth daily.  30 tablet    No current facility-administered medications on file prior to visit.     No Known Allergies  Past Medical History:  Diagnosis Date  . CAD (coronary artery disease) 2010   CABG x 1  DR. OWEN  . Hyperglycemia     Past Surgical History:  Procedure Laterality Date  . HEMORRHOID SURGERY      Social History   Tobacco Use  Smoking Status Never Smoker  Smokeless Tobacco Never Used    Social History   Substance and Sexual Activity  Alcohol Use No    Family History  Problem Relation Age of Onset  . Hypertension Father   . Diabetes Father   . Hypertension Mother   . Stroke Mother   . Diabetes Mother   . Alzheimer's disease Mother   . Hypertension Brother   . Diabetes Brother     Reviw of Systems:  Reviewed in the HPI.  All other systems are negative.  Physical Exam: Blood pressure 116/82, pulse 71, height 5' 8.5" (1.74 m), weight 218 lb 6.4 oz (99.1 kg), SpO2 97 %.  GEN:  Well nourished, well developed in no acute distress HEENT: Normal NECK: No JVD; No carotid  bruits LYMPHATICS: No lymphadenopathy CARDIAC: RRR   RESPIRATORY:  Clear to auscultation without rales, wheezing or rhonchi  ABDOMEN: Soft, non-tender, non-distended MUSCULOSKELETAL:  No edema; No deformity  SKIN: Warm and dry NEUROLOGIC:  Alert and oriented x 3   ECG:   Assessment / Plan:    1. Coronary artery disease-   he is status post coronary artery bypass grafting.  He seems to be doing well.  No episodes of angina.  He works out on a daily basis.  2. Diabetes Mellitus -   globin A1c is 6.6.  He will continue to follow-up with his primary medical doctor  3. Hyperlipidemia-    lipid levels look great.  Continue atorvastatin 20 mg a day.   Kristeen MissPhilip Aerie Donica, MD  10/22/2018 9:27 AM    [facility] [address],  [address] [place], [place]  [address] Pager [phone] Phone: [phone]; Fax: [phone]

## 2018-10-22 NOTE — Patient Instructions (Signed)
Medication Instructions:  Your physician recommends that you continue on your current medications as directed. Please refer to the Current Medication list given to you today.  If you need a refill on your cardiac medications before your next appointment, please call your pharmacy.   Lab work: Your physician recommends that you return for lab work in: 1 year on the day of or a few days before your office visit with Dr. Nahser.  You will need to FAST for this appointment - nothing to eat or drink after midnight the night before except water.   Testing/Procedures: None Ordered   Follow-Up: At [facility], you and your health needs are our priority.  As part of our continuing mission to provide you with exceptional heart care, we have created designated Provider Care Teams.  These Care Teams include your primary Cardiologist (physician) and Advanced Practice Providers (APPs -  Physician Assistants and Nurse Practitioners) who all work together to provide you with the care you need, when you need it. You will need a follow up appointment in:  1 years.  Please call our office 2 months in advance to schedule this appointment.  You may see Philip Nahser, MD or one of the following Advanced Practice Providers on your designated Care Team: Scott Weaver, PA-C Vin Bhagat, PA-C . Janine Hammond, NP   

## 2018-12-03 DIAGNOSIS — M19012 Primary osteoarthritis, left shoulder: Secondary | ICD-10-CM | POA: Diagnosis not present

## 2018-12-03 DIAGNOSIS — M25512 Pain in left shoulder: Secondary | ICD-10-CM | POA: Diagnosis not present

## 2018-12-20 ENCOUNTER — Other Ambulatory Visit: Payer: Self-pay | Admitting: Internal Medicine

## 2019-01-01 DIAGNOSIS — M19012 Primary osteoarthritis, left shoulder: Secondary | ICD-10-CM | POA: Diagnosis not present

## 2019-01-04 DIAGNOSIS — M19012 Primary osteoarthritis, left shoulder: Secondary | ICD-10-CM | POA: Insufficient documentation

## 2019-04-15 ENCOUNTER — Other Ambulatory Visit: Payer: Self-pay

## 2019-04-15 NOTE — Telephone Encounter (Signed)
Called insurance to see what lancets are covered by insurance to send to pharm

## 2019-04-21 ENCOUNTER — Other Ambulatory Visit: Payer: Self-pay | Admitting: Internal Medicine

## 2019-04-22 ENCOUNTER — Other Ambulatory Visit: Payer: Self-pay

## 2019-04-22 ENCOUNTER — Ambulatory Visit: Payer: 59 | Admitting: Nurse Practitioner

## 2019-04-22 ENCOUNTER — Ambulatory Visit (INDEPENDENT_AMBULATORY_CARE_PROVIDER_SITE_OTHER): Payer: 59 | Admitting: Internal Medicine

## 2019-04-22 ENCOUNTER — Encounter: Payer: Self-pay | Admitting: Internal Medicine

## 2019-04-22 VITALS — BP 116/82 | HR 70 | Temp 98.3°F | Ht 67.4 in | Wt 216.4 lb

## 2019-04-22 DIAGNOSIS — I25810 Atherosclerosis of coronary artery bypass graft(s) without angina pectoris: Secondary | ICD-10-CM

## 2019-04-22 DIAGNOSIS — I119 Hypertensive heart disease without heart failure: Secondary | ICD-10-CM | POA: Diagnosis not present

## 2019-04-22 DIAGNOSIS — E6609 Other obesity due to excess calories: Secondary | ICD-10-CM | POA: Insufficient documentation

## 2019-04-22 DIAGNOSIS — E1169 Type 2 diabetes mellitus with other specified complication: Secondary | ICD-10-CM

## 2019-04-22 DIAGNOSIS — E669 Obesity, unspecified: Secondary | ICD-10-CM | POA: Insufficient documentation

## 2019-04-22 DIAGNOSIS — E7800 Pure hypercholesterolemia, unspecified: Secondary | ICD-10-CM | POA: Diagnosis not present

## 2019-04-22 DIAGNOSIS — Z6833 Body mass index (BMI) 33.0-33.9, adult: Secondary | ICD-10-CM

## 2019-04-22 DIAGNOSIS — E785 Hyperlipidemia, unspecified: Secondary | ICD-10-CM

## 2019-04-22 LAB — POCT URINALYSIS DIPSTICK
Bilirubin, UA: NEGATIVE
Blood, UA: NEGATIVE
Glucose, UA: NEGATIVE
Ketones, UA: NEGATIVE
Leukocytes, UA: NEGATIVE
Nitrite, UA: NEGATIVE
Protein, UA: NEGATIVE
Spec Grav, UA: 1.025 (ref 1.010–1.025)
Urobilinogen, UA: 0.2 E.U./dL
pH, UA: 5.5 (ref 5.0–8.0)

## 2019-04-22 LAB — POCT UA - MICROALBUMIN
Albumin/Creatinine Ratio, Urine, POC: 30
Creatinine, POC: 300 mg/dL
Microalbumin Ur, POC: 30 mg/L

## 2019-04-22 NOTE — Patient Instructions (Signed)
Mediterranean Diet  A Mediterranean diet refers to food and lifestyle choices that are based on the traditions of countries located on the [place]. This way of eating has been shown to help prevent certain conditions and improve outcomes for people who have chronic diseases, like kidney disease and heart disease.  What are tips for following this plan?  Lifestyle   Cook and eat meals together with your family, when possible.   Drink enough fluid to keep your urine clear or pale yellow.   Be physically active every day. This includes:  ? Aerobic exercise like running or swimming.  ? Leisure activities like gardening, walking, or housework.   Get 7-8 hours of sleep each night.   If recommended by your health care provider, drink red wine in moderation. This means 1 glass a day for nonpregnant women and 2 glasses a day for men. A glass of wine equals 5 oz (150 mL).  Reading food labels     Check the serving size of packaged foods. For foods such as rice and pasta, the serving size refers to the amount of cooked product, not dry.   Check the total fat in packaged foods. Avoid foods that have saturated fat or trans fats.   Check the ingredients list for added sugars, such as corn syrup.  Shopping   At the grocery store, buy most of your food from the areas near the walls of the store. This includes:  ? Fresh fruits and vegetables (produce).  ? Grains, beans, nuts, and seeds. Some of these may be available in unpackaged forms or large amounts (in bulk).  ? Fresh seafood.  ? Poultry and eggs.  ? Low-fat dairy products.   Buy whole ingredients instead of prepackaged foods.   Buy fresh fruits and vegetables in-season from local farmers markets.   Buy frozen fruits and vegetables in resealable bags.   If you do not have access to quality fresh seafood, buy precooked frozen shrimp or canned fish, such as tuna, salmon, or sardines.   Buy small amounts of raw or cooked vegetables, salads, or olives from  the deli or salad bar at your store.   Stock your pantry so you always have certain foods on hand, such as olive oil, canned tuna, canned tomatoes, rice, pasta, and beans.  Cooking   Cook foods with extra-virgin olive oil instead of using butter or other vegetable oils.   Have meat as a side dish, and have vegetables or grains as your main dish. This means having meat in small portions or adding small amounts of meat to foods like pasta or stew.   Use beans or vegetables instead of meat in common dishes like chili or lasagna.   Experiment with different cooking methods. Try roasting or broiling vegetables instead of steaming or sauteing them.   Add frozen vegetables to soups, stews, pasta, or rice.   Add nuts or seeds for added healthy fat at each meal. You can add these to yogurt, salads, or vegetable dishes.   Marinate fish or vegetables using olive oil, lemon juice, garlic, and fresh herbs.  Meal planning     Plan to eat 1 vegetarian meal one day each week. Try to work up to 2 vegetarian meals, if possible.   Eat seafood 2 or more times a week.   Have healthy snacks readily available, such as:  ? Vegetable sticks with hummus.  ? Greek yogurt.  ? Fruit and nut trail mix.   Eat balanced   meals throughout the week. This includes:  ? Fruit: 2-3 servings a day  ? Vegetables: 4-5 servings a day  ? Low-fat dairy: 2 servings a day  ? Fish, poultry, or lean meat: 1 serving a day  ? Beans and legumes: 2 or more servings a week  ? Nuts and seeds: 1-2 servings a day  ? Whole grains: 6-8 servings a day  ? Extra-virgin olive oil: 3-4 servings a day   Limit red meat and sweets to only a few servings a month  What are my food choices?   Mediterranean diet  ? Recommended  ? Grains: Whole-grain pasta. Brown rice. Bulgar wheat. Polenta. Couscous. Whole-wheat bread. Oatmeal. Quinoa.  ? Vegetables: Artichokes. Beets. Broccoli. Cabbage. Carrots. Eggplant. Green beans. Chard. Kale. Spinach. Onions. Leeks. Peas. Squash.  Tomatoes. Peppers. Radishes.  ? Fruits: Apples. Apricots. Avocado. Berries. Bananas. Cherries. Dates. Figs. Grapes. Lemons. Melon. Oranges. Peaches. Plums. Pomegranate.  ? Meats and other protein foods: Beans. Almonds. Sunflower seeds. Pine nuts. Peanuts. Cod. Salmon. Scallops. Shrimp. Tuna. Tilapia. Clams. Oysters. Eggs.  ? Dairy: Low-fat milk. Cheese. Greek yogurt.  ? Beverages: Water. Red wine. Herbal tea.  ? Fats and oils: Extra virgin olive oil. Avocado oil. Grape seed oil.  ? Sweets and desserts: Greek yogurt with honey. Baked apples. Poached pears. Trail mix.  ? Seasoning and other foods: Basil. Cilantro. Coriander. Cumin. Mint. Parsley. Sage. Rosemary. Tarragon. Garlic. Oregano. Thyme. Pepper. Balsalmic vinegar. Tahini. Hummus. Tomato sauce. Olives. Mushrooms.  ? Limit these  ? Grains: Prepackaged pasta or rice dishes. Prepackaged cereal with added sugar.  ? Vegetables: Deep fried potatoes (french fries).  ? Fruits: Fruit canned in syrup.  ? Meats and other protein foods: Beef. Pork. Lamb. Poultry with skin. Hot dogs. Bacon.  ? Dairy: Ice cream. Sour cream. Whole milk.  ? Beverages: Juice. Sugar-sweetened soft drinks. Beer. Liquor and spirits.  ? Fats and oils: Butter. Canola oil. Vegetable oil. Beef fat (tallow). Lard.  ? Sweets and desserts: Cookies. Cakes. Pies. Candy.  ? Seasoning and other foods: Mayonnaise. Premade sauces and marinades.  ? The items listed may not be a complete list. Talk with your dietitian about what dietary choices are right for you.  Summary   The Mediterranean diet includes both food and lifestyle choices.   Eat a variety of fresh fruits and vegetables, beans, nuts, seeds, and whole grains.   Limit the amount of red meat and sweets that you eat.   Talk with your health care provider about whether it is safe for you to drink red wine in moderation. This means 1 glass a day for nonpregnant women and 2 glasses a day for men. A glass of wine equals 5 oz (150 mL).  This information  is not intended to replace advice given to you by your health care provider. Make sure you discuss any questions you have with your health care provider.  Document Released: 06/16/2016 Document Revised: 07/19/2016 Document Reviewed: 06/16/2016  Elsevier Interactive Patient Education  2019 Elsevier Inc.

## 2019-04-22 NOTE — Progress Notes (Signed)
Subjective:     Patient ID: Darin Pitts , male    DOB: 05-13-1956 , 63 y.o.   MRN: [id number]   Chief Complaint  Patient presents with  . Hypertension  . Diabetes  . Hyperlipidemia    HPI  Hypertension This is a chronic problem. The current episode started more than 1 year ago. The problem has been gradually improving since onset. The problem is controlled. Pertinent negatives include no blurred vision, chest pain, palpitations or shortness of breath. Risk factors for coronary artery disease include diabetes mellitus, dyslipidemia, obesity and male gender. Past treatments include beta blockers. The current treatment provides moderate improvement. Hypertensive end-organ damage includes CAD/MI.  Diabetes He presents for his follow-up diabetic visit. He has type 2 diabetes mellitus. His disease course has been improving. There are no hypoglycemic associated symptoms. There are no diabetic associated symptoms. Pertinent negatives for diabetes include no blurred vision and no chest pain. There are no hypoglycemic complications. Diabetic complications include heart disease. Risk factors for coronary artery disease include diabetes mellitus, dyslipidemia, hypertension and male sex. He is compliant with treatment most of the time. He is following a diabetic diet. He participates in exercise three times a week. Eye exam is current.  Hyperlipidemia This is a chronic problem. The current episode started more than 1 year ago. The problem is controlled. Exacerbating diseases include diabetes and obesity. Pertinent negatives include no chest pain or shortness of breath. Current antihyperlipidemic treatment includes exercise and statins. The current treatment provides moderate improvement of lipids.     Past Medical History:  Diagnosis Date  . CAD (coronary artery disease) 2010   CABG x 1  DR. OWEN  . Hyperglycemia      Family History  Problem Relation Age of Onset  . Hypertension Father   .  Diabetes Father   . Hypertension Mother   . Stroke Mother   . Diabetes Mother   . Alzheimer's disease Mother   . Hypertension Brother   . Diabetes Brother      Current Outpatient Medications:  .  aspirin EC 81 MG tablet, Take 1 tablet (81 mg total) by mouth daily., Disp: , Rfl:  .  atorvastatin (LIPITOR) 20 MG tablet, Take 1 tablet (20 mg total) by mouth daily., Disp: , Rfl:  .  KOMBIGLYZE XR 5-500 MG TB24, TAKE 1 TABLET BY MOUTH EVERY DAY, Disp: 30 tablet, Rfl: 4 .  metoprolol tartrate (LOPRESSOR) 25 MG tablet, Take 1 tablet (25 mg total) by mouth 2 (two) times daily., Disp: 180 tablet, Rfl: 3 .  Multiple Vitamin (MULTIVITAMIN) tablet, Take 1 tablet by mouth daily.  , Disp: , Rfl:    No Known Allergies   Review of Systems  Constitutional: Negative.   Eyes: Negative for blurred vision.  Respiratory: Negative.  Negative for shortness of breath.   Cardiovascular: Negative.  Negative for chest pain and palpitations.  Gastrointestinal: Negative.   Neurological: Negative.   Psychiatric/Behavioral: Negative.      Today's Vitals   04/22/19 0909  BP: 116/82  Pulse: 70  Temp: 98.3 F (36.8 C)  TempSrc: Oral  Weight: 216 lb 6.4 oz (98.2 kg)  Height: 5' 7.4" (1.712 m)   Body mass index is 33.49 kg/m.   Objective:  Physical Exam Vitals signs and nursing note reviewed.  Constitutional:      Appearance: Normal appearance.  Cardiovascular:     Rate and Rhythm: Normal rate and regular rhythm.     Heart sounds: Normal heart sounds.  Pulmonary:     Effort: Pulmonary effort is normal.     Breath sounds: Normal breath sounds.  Skin:    General: Skin is warm.  Neurological:     General: No focal deficit present.     Mental Status: He is alert.  Psychiatric:        Mood and Affect: Mood normal.         Assessment And Plan:     1. Benign hypertensive heart disease without heart failure  Well controlled. He will continue with current meds. He is encouraged to avoid adding  salt to his foods. He will rto in Dec 2020 for his next physical examination.  2. Type 2 diabetes mellitus with hyperlipidemia (HCC)  Chronic. BS readings reviewed in full detail.   3. Pure hypercholesterolemia  Chronic. He will continue with current meds.   4. Coronary artery disease involving coronary bypass graft of native heart without angina pectoris  Chronic, yet stable. He was given information on the [facility].   5. Class 1 obesity with serious comorbidity and body mass index (BMI) of 33.0 to 33.9 in adult, unspecified obesity type  Importance of achieving optimal weight to decrease risk of cardiovascular disease and cancers was discussed with the patient in full detail.  He is encouraged to strive for BMI less than 30 to decrease risk of recurrent MI.  Maximino Greenland, MD    THE PATIENT IS ENCOURAGED TO PRACTICE SOCIAL DISTANCING DUE TO THE COVID-19 PANDEMIC.

## 2019-04-23 LAB — LIPID PANEL
Chol/HDL Ratio: 2.5 ratio (ref 0.0–5.0)
Cholesterol, Total: 128 mg/dL (ref 100–199)
HDL: 52 mg/dL (ref >39)
LDL Calculated: 58 mg/dL (ref 0–99)
Triglycerides: 89 mg/dL (ref 0–149)
VLDL Cholesterol Cal: 18 mg/dL (ref 5–40)

## 2019-04-23 LAB — CMP14+EGFR
ALT: 36 IU/L (ref 0–44)
AST: 30 IU/L (ref 0–40)
Albumin/Globulin Ratio: 2.3 — ABNORMAL HIGH (ref 1.2–2.2)
Albumin: 4.6 g/dL (ref 3.8–4.8)
Alkaline Phosphatase: 42 IU/L (ref 39–117)
BUN/Creatinine Ratio: 10 (ref 10–24)
BUN: 11 mg/dL (ref 8–27)
Bilirubin Total: 0.3 mg/dL (ref 0.0–1.2)
CO2: 17 mmol/L — ABNORMAL LOW (ref 20–29)
Calcium: 9.5 mg/dL (ref 8.6–10.2)
Chloride: 111 mmol/L — ABNORMAL HIGH (ref 96–106)
Creatinine, Ser: 1.06 mg/dL (ref 0.76–1.27)
GFR calc Af Amer: 86 mL/min/{1.73_m2} (ref >59)
GFR calc non Af Amer: 74 mL/min/{1.73_m2} (ref >59)
Globulin, Total: 2 g/dL (ref 1.5–4.5)
Glucose: 148 mg/dL — ABNORMAL HIGH (ref 65–99)
Potassium: 4.2 mmol/L (ref 3.5–5.2)
Sodium: 144 mmol/L (ref 134–144)
Total Protein: 6.6 g/dL (ref 6.0–8.5)

## 2019-04-23 LAB — HEMOGLOBIN A1C
Est. average glucose Bld gHb Est-mCnc: 146 mg/dL
Hgb A1c MFr Bld: 6.7 % — ABNORMAL HIGH (ref 4.8–5.6)

## 2019-05-31 ENCOUNTER — Other Ambulatory Visit: Payer: Self-pay | Admitting: Internal Medicine

## 2019-09-17 ENCOUNTER — Encounter: Payer: Self-pay | Admitting: Internal Medicine

## 2019-09-17 LAB — HM DIABETES EYE EXAM

## 2019-10-08 ENCOUNTER — Ambulatory Visit (INDEPENDENT_AMBULATORY_CARE_PROVIDER_SITE_OTHER): Payer: 59 | Admitting: Cardiovascular Disease

## 2019-10-08 ENCOUNTER — Encounter: Payer: Self-pay | Admitting: Cardiovascular Disease

## 2019-10-08 ENCOUNTER — Encounter: Payer: Self-pay | Admitting: *Deleted

## 2019-10-08 ENCOUNTER — Other Ambulatory Visit: Payer: Self-pay

## 2019-10-08 VITALS — BP 120/84 | HR 86 | Ht 70.0 in | Wt 217.4 lb

## 2019-10-08 DIAGNOSIS — E1169 Type 2 diabetes mellitus with other specified complication: Secondary | ICD-10-CM

## 2019-10-08 DIAGNOSIS — I25810 Atherosclerosis of coronary artery bypass graft(s) without angina pectoris: Secondary | ICD-10-CM

## 2019-10-08 DIAGNOSIS — E785 Hyperlipidemia, unspecified: Secondary | ICD-10-CM

## 2019-10-08 DIAGNOSIS — Z024 Encounter for examination for driving license: Secondary | ICD-10-CM | POA: Diagnosis not present

## 2019-10-08 NOTE — Patient Instructions (Signed)
Medication Instructions:   Your physician recommends that you continue on your current medications as directed. Please refer to the Current Medication list given to you today.  *If you need a refill on your cardiac medications before your next appointment, please call your pharmacy*    Follow-Up: At [facility], you and your health needs are our priority.  As part of our continuing mission to provide you with exceptional heart care, we have created designated Provider Care Teams.  These Care Teams include your primary Cardiologist (physician) and Advanced Practice Providers (APPs -  Physician Assistants and Nurse Practitioners) who all work together to provide you with the care you need, when you need it.  Your next appointment:   12 month(s)  The format for your next appointment:   In Person  Provider:   Mertie Moores, MD

## 2019-10-08 NOTE — Progress Notes (Signed)
Darin Pitts Date of Birth  [birth year]       Darin Pitts. [address], [place] Darin Pitts, Darin Pitts  [address] [phone]   Fax  [phone]    Problem List: 1. Coronary artery disease-status post CABG by Darin Pitts in November, 2010 2. Diabetes Mellitus 3. Hyperlipidemia    Darin Pitts is a 63 y.o. gentleman with a history of coronary artery disease. He has not had any difficulty since his bypass grafting in 2010. 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  He tries to eat a low fat diet.  Nov. 3, 2014:  Darin Pitts has been seen by Darin Pitts since I last saw him.   No complaints.  He still works as a Administrator for [employer].    He is exercising regularly.   Nov. 9, 2015:  Darin Pitts is doing well.  No angina.  Working out regularly. No issues. Doing well  is fasting for lab work  Nov. 14, 2016:   Doing great . Works out regularly , no CP    Nov. 20, 2017:  Darin Pitts is seen for follow up visit . Working out regularly. No CP or dyspnea.   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   October 18, 2017:  Doing well .   Working out regularly .   October 22, 2018: Darin Pitts  is seen today for a follow-up visit. History of coronary artery disease and is status post coronary artery bypass grafting in 2010.  He has a history of hyperlipidemia. Works out regularly  Recent labs look great   October 08, 2019:  Darin Pitts is seen today for follow-up visit.  He has a history of CAD and status post CABG in 2010.  He has a history of hyperlipidemia. No cp or dyspnea  Works out regularly .    Drives for [employer]  He brought a note from Darin Pitts requesting exercise treadmill test and an updated ejection fraction which is apparently needed every 5 years.  He also is requesting a letter of cardiac clearance for Darin Pitts.  Current Outpatient Medications on File Prior to Visit  Medication Sig Dispense Refill  .  aspirin EC 81 MG tablet Take 1 tablet (81 mg total) by mouth daily.    Marland Kitchen atorvastatin (LIPITOR) 20 MG tablet TAKE ONE CAPSULE BY MOUTH AT NIGHT 90 tablet 2  . KOMBIGLYZE XR 5-500 MG TB24 TAKE 1 TABLET BY MOUTH EVERY DAY 30 tablet 4  . meloxicam (MOBIC) 7.5 MG tablet Take 7.5 mg by mouth 2 (two) times daily as needed.    . metoprolol tartrate (LOPRESSOR) 25 MG tablet Take 1 tablet (25 mg total) by mouth 2 (two) times daily. 180 tablet 3  . Multiple Vitamin (MULTIVITAMIN) tablet Take 1 tablet by mouth daily.       No current facility-administered medications on file prior to visit.     No Known Allergies  Past Medical History:  Diagnosis Date  . CAD (coronary artery disease) 2010   CABG x 1  Darin Pitts  . Hyperglycemia     Past Surgical History:  Procedure Laterality Date  . HEMORRHOID SURGERY      Social History   Tobacco Use  Smoking Status Never Smoker  Smokeless Tobacco Never Used    Social History   Substance and Sexual Activity  Alcohol Use No    Family History  Problem  Relation Age of Onset  . Hypertension Father   . Diabetes Father   . Hypertension Mother   . Stroke Mother   . Diabetes Mother   . Alzheimer's disease Mother   . Hypertension Brother   . Diabetes Brother     Reviw of Systems:  Reviewed in the HPI.  All other systems are negative.   Physical Exam: Blood pressure 120/84, pulse 86, height 5\' 10"  (1.778 m), weight 217 lb 6.4 oz (98.6 kg), SpO2 97 %.  GEN:  Well nourished, well developed in no acute distress HEENT: Normal NECK: No JVD; No carotid bruits LYMPHATICS: No lymphadenopathy CARDIAC: RRR , no murmurs, rubs, gallops RESPIRATORY:  Clear to auscultation without rales, wheezing or rhonchi  ABDOMEN: Soft, non-tender, non-distended MUSCULOSKELETAL:  No edema; No deformity  SKIN: Warm and dry NEUROLOGIC:  Alert and oriented x 3   ECG: October 08, 2019: Normal sinus rhythm at 86 beats minute.  First-degree AV block.  Possible anterior  wall myocardial infarction-old   Assessment / Plan:    1. Coronary artery disease-   Darin Pitts  is doing well.  He status post coronary artery bypass grafting in 2010.  He had a stress test from January 2019 which was normal.  He exercised for 7 minutes and had no episodes of angina.  He achieved a peak heart rate of 169 which is 106% predicted maximal heart rate.  His blood pressure response was slightly hypertensive.  This the treadmill test was normal.  He had an echocardiogram from January 2018 which revealed normal left ventricular systolic function.  His ejection fraction is 55 to 60%.  He had mild aortic insufficiency.  He exercises on a regular basis and has not had any episodes of angina.  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  2. Diabetes Mellitus -    3. Hyperlipidemia-   recentl lipids look    February 2018, MD  10/08/2019 9:51 AM    [facility] [address] [place],  [address] [place], [place]  [place] Pager [phone] Phone: [phone]; Fax: [phone]

## 2019-10-24 ENCOUNTER — Encounter: Payer: 59 | Admitting: Internal Medicine

## 2019-10-24 ENCOUNTER — Other Ambulatory Visit: Payer: Self-pay | Admitting: Cardiovascular Disease

## 2019-11-04 ENCOUNTER — Other Ambulatory Visit: Payer: Self-pay

## 2019-11-04 ENCOUNTER — Encounter: Payer: Self-pay | Admitting: Internal Medicine

## 2019-11-04 ENCOUNTER — Ambulatory Visit (INDEPENDENT_AMBULATORY_CARE_PROVIDER_SITE_OTHER): Payer: 59 | Admitting: Internal Medicine

## 2019-11-04 VITALS — BP 114/76 | HR 89 | Temp 98.2°F | Ht 68.6 in | Wt 214.2 lb

## 2019-11-04 DIAGNOSIS — E1169 Type 2 diabetes mellitus with other specified complication: Secondary | ICD-10-CM

## 2019-11-04 DIAGNOSIS — Z0000 Encounter for general adult medical examination without abnormal findings: Secondary | ICD-10-CM

## 2019-11-04 DIAGNOSIS — I25810 Atherosclerosis of coronary artery bypass graft(s) without angina pectoris: Secondary | ICD-10-CM

## 2019-11-04 DIAGNOSIS — Z6832 Body mass index (BMI) 32.0-32.9, adult: Secondary | ICD-10-CM

## 2019-11-04 DIAGNOSIS — E6609 Other obesity due to excess calories: Secondary | ICD-10-CM

## 2019-11-04 DIAGNOSIS — Z1211 Encounter for screening for malignant neoplasm of colon: Secondary | ICD-10-CM

## 2019-11-04 DIAGNOSIS — I119 Hypertensive heart disease without heart failure: Secondary | ICD-10-CM

## 2019-11-04 DIAGNOSIS — E785 Hyperlipidemia, unspecified: Secondary | ICD-10-CM

## 2019-11-04 LAB — POCT URINALYSIS DIPSTICK
Bilirubin, UA: NEGATIVE
Blood, UA: NEGATIVE
Glucose, UA: NEGATIVE
Ketones, UA: NEGATIVE
Leukocytes, UA: NEGATIVE
Nitrite, UA: NEGATIVE
Protein, UA: NEGATIVE
Spec Grav, UA: 1.025 (ref 1.010–1.025)
Urobilinogen, UA: 0.2 E.U./dL
pH, UA: 5.5 (ref 5.0–8.0)

## 2019-11-04 LAB — POCT UA - MICROALBUMIN
Albumin/Creatinine Ratio, Urine, POC: 30
Creatinine, POC: 100 mg/dL
Microalbumin Ur, POC: 10 mg/L

## 2019-11-04 MED ORDER — KOMBIGLYZE XR 5-500 MG PO TB24: 1.0000 | ORAL_TABLET | Freq: Every day | ORAL | 5 refills | Status: DC

## 2019-11-04 MED ORDER — ATORVASTATIN CALCIUM 20 MG PO TABS: 20.0000 mg | ORAL_TABLET | Freq: Every day | ORAL | 2 refills | Status: DC

## 2019-11-04 NOTE — Patient Instructions (Signed)

## 2019-11-04 NOTE — Progress Notes (Signed)
This visit occurred during the SARS-CoV-2 public health emergency.  Safety protocols were in place, including screening questions prior to the visit, additional usage of staff PPE, and extensive cleaning of exam room while observing appropriate contact time as indicated for disinfecting solutions.  Subjective:     Patient ID: Darin Pitts , male    DOB: [birth year] , 63 y.o.   MRN: [id number]   Chief Complaint  Patient presents with  . Annual Exam  . Diabetes  . Hypertension    HPI  He is here today for a full physical examination. He reports compliance with meds. He has no specific concerns or complaints at this time.   Diabetes He presents for his follow-up diabetic visit. He has type 2 diabetes mellitus. His disease course has been improving. There are no hypoglycemic associated symptoms. There are no diabetic associated symptoms. Pertinent negatives for diabetes include no blurred vision. There are no hypoglycemic complications. Diabetic complications include heart disease. Risk factors for coronary artery disease include diabetes mellitus, dyslipidemia, hypertension and male sex. He is compliant with treatment most of the time. He is following a diabetic diet. He participates in exercise three times a week. Eye exam is current.  Hypertension This is a chronic problem. The current episode started more than 1 year ago. The problem has been gradually improving since onset. The problem is controlled. Pertinent negatives include no blurred vision or palpitations. Risk factors for coronary artery disease include diabetes mellitus, dyslipidemia, obesity and male gender. Past treatments include beta blockers. The current treatment provides moderate improvement. Hypertensive end-organ damage includes CAD/MI.     Past Medical History:  Diagnosis Date  . CAD (coronary artery disease) 2010   CABG x 1  DR. OWEN  . Hyperglycemia      Family History  Problem Relation Age of Onset  .  Hypertension Father   . Diabetes Father   . Hypertension Mother   . Stroke Mother   . Diabetes Mother   . Alzheimer's disease Mother   . Hypertension Brother   . Diabetes Brother      Current Outpatient Medications:  .  aspirin EC 81 MG tablet, Take 1 tablet (81 mg total) by mouth daily., Disp: , Rfl:  .  atorvastatin (LIPITOR) 20 MG tablet, Take 1 tablet (20 mg total) by mouth daily at 6 PM., Disp: 90 tablet, Rfl: 2 .  metoprolol tartrate (LOPRESSOR) 25 MG tablet, TAKE 1 TABLET BY MOUTH TWICE A DAY, Disp: 180 tablet, Rfl: 3 .  Multiple Vitamin (MULTIVITAMIN) tablet, Take 1 tablet by mouth daily.  , Disp: , Rfl:  .  Saxagliptin-Metformin (KOMBIGLYZE XR) 5-500 MG TB24, Take 1 tablet by mouth daily., Disp: 30 tablet, Rfl: 5   No Known Allergies   Men's preventive visit. Patient Health Questionnaire (PHQ-2) is    Office Visit from 04/22/2019 in [facility]  PHQ-2 Total Score  0    . Patient is on a healthy diet. Marital status: Married. Relevant history for alcohol use is:  Social History   Substance and Sexual Activity  Alcohol Use No  . Relevant history for tobacco use is:  Social History   Tobacco Use  Smoking Status Never Smoker  Smokeless Tobacco Never Used  .  Review of Systems  Constitutional: Negative.   HENT: Negative.   Eyes: Negative.  Negative for blurred vision.  Respiratory: Negative.   Cardiovascular: Negative.  Negative for palpitations.  Endocrine: Negative.   Genitourinary: Negative.   Musculoskeletal:  Negative.   Skin: Negative.   Allergic/Immunologic: Negative.   Neurological: Negative.   Hematological: Negative.   Psychiatric/Behavioral: Negative.      Today's Vitals   11/04/19 1048  BP: 114/76  Pulse: 89  Temp: 98.2 F (36.8 C)  TempSrc: Oral  Weight: 214 lb 3.2 oz (97.2 kg)  Height: 5' 8.6" (1.742 m)   Body mass index is 32 kg/m.   Objective:  Physical Exam Vitals and nursing note reviewed.  Constitutional:       Appearance: Normal appearance. He is obese.  HENT:     Head: Normocephalic and atraumatic.     Right Ear: Tympanic membrane, ear canal and external ear normal.     Left Ear: Tympanic membrane, ear canal and external ear normal.     Nose: Nose normal.     Mouth/Throat:     Mouth: Mucous membranes are moist.     Pharynx: Oropharynx is clear.  Eyes:     Extraocular Movements: Extraocular movements intact.     Conjunctiva/sclera: Conjunctivae normal.     Pupils: Pupils are equal, round, and reactive to light.  Cardiovascular:     Rate and Rhythm: Normal rate and regular rhythm.     Pulses: Normal pulses.          Dorsalis pedis pulses are 2+ on the right side and 2+ on the left side.     Heart sounds: Normal heart sounds.  Pulmonary:     Effort: Pulmonary effort is normal.     Breath sounds: Normal breath sounds.  Chest:     Breasts:        Right: Normal. No swelling, bleeding, inverted nipple, mass or nipple discharge.        Left: Normal. No swelling, bleeding, inverted nipple, mass or nipple discharge.  Abdominal:     General: Abdomen is flat. Bowel sounds are normal.     Palpations: Abdomen is soft.  Musculoskeletal:        General: Normal range of motion.     Cervical back: Normal range of motion and neck supple.  Feet:     Right foot:     Protective Sensation: 5 sites tested. 5 sites sensed.     Skin integrity: Skin integrity normal.     Toenail Condition: Right toenails are normal.     Left foot:     Protective Sensation: 5 sites tested. 5 sites sensed.     Skin integrity: Skin integrity normal.     Toenail Condition: Left toenails are normal.  Skin:    General: Skin is warm.  Neurological:     General: No focal deficit present.     Mental Status: He is alert.  Psychiatric:        Mood and Affect: Mood normal.        Behavior: Behavior normal.         Assessment And Plan:     1. Routine general medical examination at health care facility  A full exam  was performed. DRE deferred as per patient request. PATIENT HAS BEEN ADVISED TO GET 30-45 MINUTES REGULAR EXERCISE NO LESS THAN FOUR TO FIVE DAYS PER WEEK - BOTH WEIGHTBEARING EXERCISES AND AEROBIC ARE RECOMMENDED.  HE IS ADVISED TO FOLLOW A HEALTHY DIET WITH AT LEAST SIX FRUITS/VEGGIES PER DAY, DECREASE INTAKE OF RED MEAT, AND TO INCREASE FISH INTAKE TO TWO DAYS PER WEEK.  MEATS/FISH SHOULD NOT BE FRIED, BAKED OR BROILED IS PREFERABLE.  I SUGGEST WEARING SPF 50 SUNSCREEN  ON EXPOSED PARTS AND ESPECIALLY WHEN IN THE DIRECT SUNLIGHT FOR AN EXTENDED PERIOD OF TIME.  PLEASE AVOID FAST FOOD RESTAURANTS AND INCREASE YOUR WATER INTAKE.  - CMP14+EGFR - CBC - Lipid panel - Hemoglobin A1c - PSA  2. Type 2 diabetes mellitus with hyperlipidemia (HCC)  Diabetic foot exam was performed. I DISCUSSED WITH THE PATIENT AT LENGTH REGARDING THE GOALS OF GLYCEMIC CONTROL AND POSSIBLE LONG-TERM COMPLICATIONS.  I  ALSO STRESSED THE IMPORTANCE OF COMPLIANCE WITH HOME GLUCOSE MONITORING, DIETARY RESTRICTIONS INCLUDING AVOIDANCE OF SUGARY DRINKS/PROCESSED FOODS,  ALONG WITH REGULAR EXERCISE.  I  ALSO STRESSED THE IMPORTANCE OF ANNUAL EYE EXAMS, SELF FOOT CARE AND COMPLIANCE WITH OFFICE VISITS.  - POCT Urinalysis Dipstick (81002) - POCT UA - Microalbumin  3. Benign hypertensive heart disease without heart failure  Chronic, well controlled. He will continue with current meds. He is encouraged to avoid adding salt to his foods. EKG performed, no new changes.  - EKG 12-Lead  4. Coronary artery disease involving coronary bypass graft of native heart without angina pectoris  Chronic, yet stable. Importance of dietary and medication compliance is discussed with the patient.   5. Class 1 obesity due to excess calories with serious comorbidity and body mass index (BMI) of 32.0 to 32.9 in adult  He is encouraged to strive for BMI less than 28 to decrease cardiac risk. He is encouraged to exercise no less than 150 minutes per  week.   6. Screen for colon cancer  I will refer him to GI for CRC screening.   - Ambulatory referral to Gastroenterology[facility]   Maximino Greenland, MD    THE PATIENT IS ENCOURAGED TO PRACTICE SOCIAL DISTANCING DUE TO THE COVID-19 PANDEMIC.

## 2019-11-05 LAB — CMP14+EGFR
ALT: 50 IU/L — ABNORMAL HIGH (ref 0–44)
AST: 31 IU/L (ref 0–40)
Albumin/Globulin Ratio: 1.8 (ref 1.2–2.2)
Albumin: 4.5 g/dL (ref 3.8–4.8)
Alkaline Phosphatase: 49 IU/L (ref 39–117)
BUN/Creatinine Ratio: 9 — ABNORMAL LOW (ref 10–24)
BUN: 10 mg/dL (ref 8–27)
Bilirubin Total: 0.3 mg/dL (ref 0.0–1.2)
CO2: 24 mmol/L (ref 20–29)
Calcium: 9.7 mg/dL (ref 8.6–10.2)
Chloride: 106 mmol/L (ref 96–106)
Creatinine, Ser: 1.1 mg/dL (ref 0.76–1.27)
GFR calc Af Amer: 82 mL/min/{1.73_m2} (ref >59)
GFR calc non Af Amer: 71 mL/min/{1.73_m2} (ref >59)
Globulin, Total: 2.5 g/dL (ref 1.5–4.5)
Glucose: 114 mg/dL — ABNORMAL HIGH (ref 65–99)
Potassium: 4.9 mmol/L (ref 3.5–5.2)
Sodium: 141 mmol/L (ref 134–144)
Total Protein: 7 g/dL (ref 6.0–8.5)

## 2019-11-05 LAB — LIPID PANEL
Chol/HDL Ratio: 2.3 ratio (ref 0.0–5.0)
Cholesterol, Total: 109 mg/dL (ref 100–199)
HDL: 47 mg/dL (ref >39)
LDL Chol Calc (NIH): 46 mg/dL (ref 0–99)
Triglycerides: 78 mg/dL (ref 0–149)
VLDL Cholesterol Cal: 16 mg/dL (ref 5–40)

## 2019-11-05 LAB — CBC
Hematocrit: 41.9 % (ref 37.5–51.0)
Hemoglobin: 14.1 g/dL (ref 13.0–17.7)
MCH: 29.3 pg (ref 26.6–33.0)
MCHC: 33.7 g/dL (ref 31.5–35.7)
MCV: 87 fL (ref 79–97)
Platelets: 193 10*3/uL (ref 150–450)
RBC: 4.81 x10E6/uL (ref 4.14–5.80)
RDW: 12.9 % (ref 11.6–15.4)
WBC: 6.7 10*3/uL (ref 3.4–10.8)

## 2019-11-05 LAB — HEMOGLOBIN A1C
Est. average glucose Bld gHb Est-mCnc: 140 mg/dL
Hgb A1c MFr Bld: 6.5 % — ABNORMAL HIGH (ref 4.8–5.6)

## 2019-11-05 LAB — PSA: Prostate Specific Ag, Serum: 0.4 ng/mL (ref 0.0–4.0)

## 2019-11-11 ENCOUNTER — Ambulatory Visit: Payer: 59 | Admitting: Cardiovascular Disease

## 2019-12-13 ENCOUNTER — Telehealth: Payer: Self-pay | Admitting: *Deleted

## 2019-12-13 NOTE — Telephone Encounter (Signed)
   [facility] Pre-operative Risk Assessment    Request for surgical clearance:  1. What type of surgery is being performed? COLONOSCOPY   2. When is this surgery scheduled? 01/13/20   3. What type of clearance is required (medical clearance vs. Pharmacy clearance to hold med vs. Both)? MEDICAL  4. Are there any medications that need to be held prior to surgery and how long? ASA   5. Practice name and name of physician performing surgery? [facility]; DR. Mar Daring NAT MANN   6. What is your office phone number [phone]    7.   What is your office fax number [phone]  8.   Anesthesia type (None, local, MAC, general) ? PROPOFOL   Julaine Hua 12/13/2019, 4:10 PM  _________________________________________________________________   (provider comments below)

## 2019-12-13 NOTE — Telephone Encounter (Signed)
   Primary Cardiologist: Kristeen Miss, MD 10/08/2019  Will route to Dr Elease Hashimoto to address holding ASA.   Chart reviewed as part of pre-operative protocol coverage. Given past medical history and time since last visit, based on ACC/AHA guidelines, LENG MONTESDEOCA would be at acceptable risk for the planned procedure without further cardiovascular testing.   I will route this recommendation to the requesting party via Epic fax function and remove from pre-op pool.  Please call with questions.  Theodore Demark, PA-C 12/13/2019, 4:40 PM

## 2019-12-16 NOTE — Telephone Encounter (Signed)
   Primary Cardiologist: Kristeen Miss, MD  Chart reviewed as part of pre-operative protocol coverage. Patient last seen by Dr. Elease Hashimoto on 10/08/2019 at which time he was doing well from a cardiac standpoint. 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   OK to hold Aspirin for 5-7 days prior to colonoscopy. Should be restarted as soon as able following procedure.   I will route this recommendation to the requesting party via Epic fax function and remove from pre-op pool.  Please call with questions.  Corrin Parker, PA-C 12/16/2019, 1:36 PM

## 2019-12-16 NOTE — Telephone Encounter (Signed)
Mr. Panning may hold his ASA for 5-7 days prior to colonocsopy    Kristeen Miss, MD  12/16/2019 1:27 PM    [facility] [address] [place],  [address] [place], [place]  [address] Phone: [phone]; Fax: [phone]

## 2020-01-13 LAB — HM COLONOSCOPY

## 2020-01-14 ENCOUNTER — Encounter: Payer: Self-pay | Admitting: Internal Medicine

## 2020-02-13 ENCOUNTER — Encounter: Payer: Self-pay | Admitting: Internal Medicine

## 2020-03-02 ENCOUNTER — Ambulatory Visit: Payer: 59 | Admitting: Internal Medicine

## 2020-03-02 ENCOUNTER — Other Ambulatory Visit: Payer: Self-pay

## 2020-03-02 ENCOUNTER — Encounter: Payer: Self-pay | Admitting: Internal Medicine

## 2020-03-02 VITALS — BP 132/82 | HR 71 | Temp 97.6°F | Ht 69.8 in | Wt 216.6 lb

## 2020-03-02 DIAGNOSIS — E1169 Type 2 diabetes mellitus with other specified complication: Secondary | ICD-10-CM

## 2020-03-02 DIAGNOSIS — E785 Hyperlipidemia, unspecified: Secondary | ICD-10-CM

## 2020-03-02 DIAGNOSIS — I119 Hypertensive heart disease without heart failure: Secondary | ICD-10-CM

## 2020-03-02 DIAGNOSIS — I25810 Atherosclerosis of coronary artery bypass graft(s) without angina pectoris: Secondary | ICD-10-CM | POA: Diagnosis not present

## 2020-03-02 DIAGNOSIS — E6609 Other obesity due to excess calories: Secondary | ICD-10-CM

## 2020-03-02 DIAGNOSIS — Z6831 Body mass index (BMI) 31.0-31.9, adult: Secondary | ICD-10-CM

## 2020-03-02 NOTE — Progress Notes (Addendum)
This visit occurred during the SARS-CoV-2 public health emergency.  Safety protocols were in place, including screening questions prior to the visit, additional usage of staff PPE, and extensive cleaning of exam room while observing appropriate contact time as indicated for disinfecting solutions.  Subjective:     Patient ID: Darin Pitts , male    DOB: 04-Mar-1956 , 64 y.o.   MRN: [id number]   Chief Complaint  Patient presents with  . Diabetes  . Hypertension    HPI  He presents today for DM/HTN f/u. He reports compliance with meds. He still exercises on a regular basis, at least 150 minutes per week. He has no specific concerns or complaints at this time. He denies chest pain, palpitations and headaches.   Diabetes He presents for his follow-up diabetic visit. He has type 2 diabetes mellitus. His disease course has been improving. There are no hypoglycemic associated symptoms. There are no diabetic associated symptoms. Pertinent negatives for diabetes include no blurred vision. There are no hypoglycemic complications. Diabetic complications include heart disease. Risk factors for coronary artery disease include diabetes mellitus, dyslipidemia, hypertension and male sex. He is compliant with treatment most of the time. He is following a diabetic diet. He participates in exercise three times a week. Eye exam is current.  Hypertension This is a chronic problem. The current episode started more than 1 year ago. The problem has been gradually improving since onset. The problem is controlled. Pertinent negatives include no blurred vision or palpitations. Risk factors for coronary artery disease include diabetes mellitus, dyslipidemia, obesity and male gender. Past treatments include beta blockers. The current treatment provides moderate improvement. Hypertensive end-organ damage includes CAD/MI.     Past Medical History:  Diagnosis Date  . CAD (coronary artery disease) 2010   CABG x 1  DR.  OWEN  . Hyperglycemia      Family History  Problem Relation Age of Onset  . Hypertension Father   . Diabetes Father   . Hypertension Mother   . Stroke Mother   . Diabetes Mother   . Alzheimer's disease Mother   . Hypertension Brother   . Diabetes Brother      Current Outpatient Medications:  .  aspirin EC 81 MG tablet, Take 1 tablet (81 mg total) by mouth daily., Disp: , Rfl:  .  atorvastatin (LIPITOR) 20 MG tablet, Take 1 tablet (20 mg total) by mouth daily at 6 PM., Disp: 90 tablet, Rfl: 2 .  metoprolol tartrate (LOPRESSOR) 25 MG tablet, TAKE 1 TABLET BY MOUTH TWICE A DAY, Disp: 180 tablet, Rfl: 3 .  Multiple Vitamin (MULTIVITAMIN) tablet, Take 1 tablet by mouth daily.  , Disp: , Rfl:  .  Saxagliptin-Metformin (KOMBIGLYZE XR) 5-500 MG TB24, Take 1 tablet by mouth daily., Disp: 30 tablet, Rfl: 5   No Known Allergies   Review of Systems  Constitutional: Negative.   Eyes: Negative for blurred vision.  Respiratory: Negative.   Cardiovascular: Negative.  Negative for palpitations.  Gastrointestinal: Negative.   Neurological: Negative.   Psychiatric/Behavioral: Negative.      Today's Vitals   03/02/20 0939  BP: 132/82  Pulse: 71  Temp: 97.6 F (36.4 C)  Weight: 216 lb 9.6 oz (98.2 kg)  Height: 5' 9.8" (1.773 m)   Body mass index is 31.26 kg/m.   Objective:  Physical Exam Vitals and nursing note reviewed.  Constitutional:      Appearance: Normal appearance.  Cardiovascular:     Rate and Rhythm: Normal rate and  regular rhythm.     Heart sounds: Normal heart sounds.  Pulmonary:     Effort: Pulmonary effort is normal.     Breath sounds: Normal breath sounds.  Skin:    General: Skin is warm.  Neurological:     General: No focal deficit present.     Mental Status: He is alert.  Psychiatric:        Mood and Affect: Mood normal.         Assessment And Plan:     1. Type 2 diabetes mellitus with hyperlipidemia (HCC)  Chronic, I will check labs as listed  below. Importance of dietary and medication compliance was discussed with the patient. We discussed transitioning from [place] to [place] - importance of cardiac risk reduction was discussed with the patient. We also discussed the association between DM and heart failure. All questions were answered to his satisfaction. I will provide him with 5/542m samples once available. He agrees to rto four weeks after starting the new medication.    - Hemoglobin A1c - BMP8+EGFR  2. Benign hypertensive heart disease without heart failure  Chronic, controlled. He will continue with current meds. He is encouraged to avoid adding salt to his foods. I will check renal function today.   3. Coronary artery disease involving coronary bypass graft of native heart without angina pectoris  Chronic, he is asymptomatic. He is encouraged to continue with his heart healthy lifestyle and to take meds as prescribed.   4. Class 1 obesity due to excess calories with serious comorbidity and body mass index (BMI) of 31.0 to 31.9 in adult  He is encouraged to strive for BMi less than 29 to decrease cardiac risk. He is encouraged to focus on losing 7-10 pounds to further decrease his risk. He is advised to avoid sugary beverages and packaged foods.   RMaximino Greenland MD    THE PATIENT IS ENCOURAGED TO PRACTICE SOCIAL DISTANCING DUE TO THE COVID-19 PANDEMIC.

## 2020-03-02 NOTE — Patient Instructions (Signed)

## 2020-03-03 LAB — BMP8+EGFR
BUN/Creatinine Ratio: 11 (ref 10–24)
BUN: 11 mg/dL (ref 8–27)
CO2: 21 mmol/L (ref 20–29)
Calcium: 9.7 mg/dL (ref 8.6–10.2)
Chloride: 106 mmol/L (ref 96–106)
Creatinine, Ser: 1.03 mg/dL (ref 0.76–1.27)
GFR calc Af Amer: 88 mL/min/{1.73_m2} (ref >59)
GFR calc non Af Amer: 76 mL/min/{1.73_m2} (ref >59)
Glucose: 153 mg/dL — ABNORMAL HIGH (ref 65–99)
Potassium: 4.4 mmol/L (ref 3.5–5.2)
Sodium: 140 mmol/L (ref 134–144)

## 2020-03-03 LAB — HEMOGLOBIN A1C
Est. average glucose Bld gHb Est-mCnc: 163 mg/dL
Hgb A1c MFr Bld: 7.3 % — ABNORMAL HIGH (ref 4.8–5.6)

## 2020-03-11 ENCOUNTER — Telehealth: Payer: Self-pay

## 2020-03-11 NOTE — Telephone Encounter (Signed)
The pt said that he thinks his a1c may be up because he was eating bananas.     Here are your lab results:  Your hba1c is 7.3, up from last visit. It was previously 6.5. What do you think happened?  Let's get back on track.   Your kidney function is stable.   Please let me know if you have any questions or concerns. Stay safe!   Sincerely,    Robyn N. Allyne Gee, MD

## 2020-03-16 ENCOUNTER — Encounter: Payer: Self-pay | Admitting: Internal Medicine

## 2020-04-20 ENCOUNTER — Other Ambulatory Visit: Payer: Self-pay

## 2020-04-20 ENCOUNTER — Other Ambulatory Visit: Payer: Self-pay | Admitting: Internal Medicine

## 2020-04-20 MED ORDER — UNILET COMFORTOUCH LANCET MISC: 1 refills | Status: DC

## 2020-04-22 ENCOUNTER — Telehealth: Payer: Self-pay

## 2020-04-22 NOTE — Telephone Encounter (Signed)
I was calling the pt to see what brand lancets does he need because [facility] said they are unable to contact the pt.

## 2020-06-05 ENCOUNTER — Other Ambulatory Visit: Payer: Self-pay | Admitting: Internal Medicine

## 2020-07-06 ENCOUNTER — Ambulatory Visit: Payer: 59 | Admitting: Internal Medicine

## 2020-07-06 ENCOUNTER — Encounter: Payer: Self-pay | Admitting: Internal Medicine

## 2020-07-06 ENCOUNTER — Other Ambulatory Visit: Payer: Self-pay

## 2020-07-06 VITALS — BP 118/76 | HR 76 | Temp 97.8°F | Ht 69.8 in | Wt 213.0 lb

## 2020-07-06 DIAGNOSIS — I25810 Atherosclerosis of coronary artery bypass graft(s) without angina pectoris: Secondary | ICD-10-CM

## 2020-07-06 DIAGNOSIS — I119 Hypertensive heart disease without heart failure: Secondary | ICD-10-CM

## 2020-07-06 DIAGNOSIS — E6609 Other obesity due to excess calories: Secondary | ICD-10-CM

## 2020-07-06 DIAGNOSIS — E1169 Type 2 diabetes mellitus with other specified complication: Secondary | ICD-10-CM

## 2020-07-06 DIAGNOSIS — E785 Hyperlipidemia, unspecified: Secondary | ICD-10-CM

## 2020-07-06 DIAGNOSIS — Z6830 Body mass index (BMI) 30.0-30.9, adult: Secondary | ICD-10-CM

## 2020-07-06 MED ORDER — XIGDUO XR 10-500 MG PO TB24: 1.0000 | ORAL_TABLET | Freq: Every day | ORAL | 5 refills | Status: DC

## 2020-07-06 NOTE — Progress Notes (Signed)
This visit occurred during the SARS-CoV-2 public health emergency.  Safety protocols were in place, including screening questions prior to the visit, additional usage of staff PPE, and extensive cleaning of exam room while observing appropriate contact time as indicated for disinfecting solutions.  Subjective:     Patient ID: Darin Pitts , male    DOB: 1956/01/14 , 64 y.o.   MRN: [id number]   Chief Complaint  Patient presents with  . Diabetes  . Hypertension    HPI  He presents today for DM/HTN f/u. He reports compliance with meds. He still exercises on a regular basis, at least 150 minutes per week. He has no specific concerns or complaints at this time. He denies chest pain, palpitations and headaches.   Diabetes He presents for his follow-up diabetic visit. He has type 2 diabetes mellitus. His disease course has been improving. There are no hypoglycemic associated symptoms. There are no diabetic associated symptoms. Pertinent negatives for diabetes include no blurred vision. There are no hypoglycemic complications. Diabetic complications include heart disease. Risk factors for coronary artery disease include diabetes mellitus, dyslipidemia, hypertension and male sex. He is compliant with treatment most of the time. He is following a diabetic diet. He participates in exercise three times a week. Eye exam is current.  Hypertension This is a chronic problem. The current episode started more than 1 year ago. The problem has been gradually improving since onset. The problem is controlled. Pertinent negatives include no blurred vision or palpitations. Risk factors for coronary artery disease include diabetes mellitus, dyslipidemia, obesity and male gender. Past treatments include beta blockers. The current treatment provides moderate improvement. Hypertensive end-organ damage includes CAD/MI.     Past Medical History:  Diagnosis Date  . CAD (coronary artery disease) 2010   CABG x 1  DR.  OWEN  . Hyperglycemia      Family History  Problem Relation Age of Onset  . Hypertension Father   . Diabetes Father   . Hypertension Mother   . Stroke Mother   . Diabetes Mother   . Alzheimer's disease Mother   . Hypertension Brother   . Diabetes Brother      Current Outpatient Medications:  .  aspirin EC 81 MG tablet, Take 1 tablet (81 mg total) by mouth daily., Disp: , Rfl:  .  atorvastatin (LIPITOR) 20 MG tablet, Take 1 tablet (20 mg total) by mouth daily at 6 PM., Disp: 90 tablet, Rfl: 2 .  KOMBIGLYZE XR 5-500 MG TB24, TAKE 1 TABLET BY MOUTH EVERY DAY, Disp: 30 tablet, Rfl: 5 .  metoprolol tartrate (LOPRESSOR) 25 MG tablet, TAKE 1 TABLET BY MOUTH TWICE A DAY, Disp: 180 tablet, Rfl: 3 .  Multiple Vitamin (MULTIVITAMIN) tablet, Take 1 tablet by mouth daily.  , Disp: , Rfl:  .  ONETOUCH ULTRA test strip, CHECK BLOOD SUGAR EVERY DAY, Disp: 100 strip, Rfl: 3 .  Dapagliflozin-metFORMIN HCl ER (XIGDUO XR) 10-500 MG TB24, Take 1 tablet by mouth daily., Disp: 30 tablet, Rfl: 5 .  Lancets (UNILET COMFORTOUCH LANCET) MISC, Use as directed to check blood sugars 1 time per day dx: e11.65, Disp: 100 each, Rfl: 1   No Known Allergies   Review of Systems  Constitutional: Negative.   HENT: Negative.   Eyes: Negative for blurred vision.  Respiratory: Negative.   Cardiovascular: Negative for palpitations.  Skin: Negative.   Neurological: Negative.   Psychiatric/Behavioral: Negative.      Today's Vitals   07/06/20 0914  BP: 118/76  Pulse: 76  Temp: 97.8 F (36.6 C)  TempSrc: Oral  Weight: 213 lb (96.6 kg)  Height: 5' 9.8" (1.773 m)  PainSc: 0-No pain   Body mass index is 30.74 kg/m.   Objective:  Physical Exam Vitals and nursing note reviewed.  Constitutional:      Appearance: Normal appearance.  Cardiovascular:     Rate and Rhythm: Normal rate and regular rhythm.     Heart sounds: Normal heart sounds.  Pulmonary:     Effort: Pulmonary effort is normal.     Breath  sounds: Normal breath sounds.  Skin:    General: Skin is warm.  Neurological:     General: No focal deficit present.     Mental Status: He is alert.  Psychiatric:        Mood and Affect: Mood normal.         Assessment And Plan:     1. Type 2 diabetes mellitus with hyperlipidemia (HCC) Comments: Chronic. We discussed switching to Xigduo 10/500 from [place]. Possible side effects discussed with the patient including balanitis and urinary frequency. He agrees to rto in six weeks for f/u.  - Hemoglobin A1c - CMP14+EGFR  2. Benign hypertensive heart disease without heart failure Comments: Chronic, well controlled. He will continue with current meds. He is encouraged to cut back on his intake of processed meats.   3. Coronary artery disease involving coronary bypass graft of native heart without angina pectoris Comments: Chronic, yet stable.  He is encouraged to follow a heart healthy lifestyle.   4. Class 1 obesity due to excess calories with serious comorbidity and body mass index (BMI) of 30.0 to 30.9 in adult   He is encouraged to aim for BMI less than 28 to decrease cardiac risk.   Wt Readings from Last 3 Encounters:  07/06/20 213 lb (96.6 kg)  03/02/20 216 lb 9.6 oz (98.2 kg)  11/04/19 214 lb 3.2 oz (97.2 kg)    Patient was given opportunity to ask questions. Patient verbalized understanding of the plan and was able to repeat key elements of the plan. All questions were answered to their satisfaction.  Maximino Greenland, MD   I, Maximino Greenland, MD, have reviewed all documentation for this visit. The documentation on 07/06/20 for the exam, diagnosis, procedures, and orders are all accurate and complete.  THE PATIENT IS ENCOURAGED TO PRACTICE SOCIAL DISTANCING DUE TO THE COVID-19 PANDEMIC.

## 2020-07-06 NOTE — Patient Instructions (Signed)

## 2020-07-07 LAB — CMP14+EGFR
ALT: 50 IU/L — ABNORMAL HIGH (ref 0–44)
AST: 27 IU/L (ref 0–40)
Albumin/Globulin Ratio: 1.8 (ref 1.2–2.2)
Albumin: 4.6 g/dL (ref 3.8–4.8)
Alkaline Phosphatase: 50 IU/L (ref 48–121)
BUN/Creatinine Ratio: 12 (ref 10–24)
BUN: 12 mg/dL (ref 8–27)
Bilirubin Total: 0.3 mg/dL (ref 0.0–1.2)
CO2: 22 mmol/L (ref 20–29)
Calcium: 9.8 mg/dL (ref 8.6–10.2)
Chloride: 106 mmol/L (ref 96–106)
Creatinine, Ser: 1 mg/dL (ref 0.76–1.27)
GFR calc Af Amer: 92 mL/min/{1.73_m2} (ref >59)
GFR calc non Af Amer: 79 mL/min/{1.73_m2} (ref >59)
Globulin, Total: 2.5 g/dL (ref 1.5–4.5)
Glucose: 139 mg/dL — ABNORMAL HIGH (ref 65–99)
Potassium: 4.6 mmol/L (ref 3.5–5.2)
Sodium: 139 mmol/L (ref 134–144)
Total Protein: 7.1 g/dL (ref 6.0–8.5)

## 2020-07-07 LAB — HEMOGLOBIN A1C
Est. average glucose Bld gHb Est-mCnc: 151 mg/dL
Hgb A1c MFr Bld: 6.9 % — ABNORMAL HIGH (ref 4.8–5.6)

## 2020-08-11 ENCOUNTER — Telehealth: Payer: Self-pay

## 2020-08-11 NOTE — Telephone Encounter (Signed)
He probably needs a prior [place]. If he communicated what the issue was, we can correct it. I prefer the newer medication because this can help to decrease cardiac risk. If he does not wish to be proactive in this way, that is his choice. Will discuss further at next visit.

## 2020-08-11 NOTE — Telephone Encounter (Signed)
The pt called and wanted to cancel his appt for oct 11th because he stopped taking the xigduo medication that he was having a follow-up on and started back taking his kombiglyze because the new medication he said was too expensive.  I asked the pt why didn't he call to let Dr. Allyne Gee know about the issue and he said that he kept meaning to and the he will take what he has been taking until he sees her at his next appt.

## 2020-08-17 ENCOUNTER — Ambulatory Visit: Payer: 59 | Admitting: Internal Medicine

## 2020-08-19 ENCOUNTER — Telehealth: Payer: Self-pay

## 2020-08-19 NOTE — Telephone Encounter (Signed)
Patient notified that Dr. Grayling Congress said he probably needs a prior auth. If he communicated what the issue was, we can correct it. Dr. Allyne Gee prefers the newer medication because this can help to decrease cardiac risk. If he does not wish to be proactive in this way, that is his choice. Will discuss further at next visit.

## 2020-09-01 ENCOUNTER — Encounter: Payer: Self-pay | Admitting: Cardiovascular Disease

## 2020-09-01 ENCOUNTER — Other Ambulatory Visit: Payer: Self-pay

## 2020-09-01 ENCOUNTER — Ambulatory Visit: Payer: 59 | Admitting: Cardiovascular Disease

## 2020-09-01 VITALS — BP 110/76 | HR 80 | Ht 70.0 in | Wt 218.4 lb

## 2020-09-01 DIAGNOSIS — E782 Mixed hyperlipidemia: Secondary | ICD-10-CM | POA: Diagnosis not present

## 2020-09-01 DIAGNOSIS — I25810 Atherosclerosis of coronary artery bypass graft(s) without angina pectoris: Secondary | ICD-10-CM

## 2020-09-01 LAB — LIPID PANEL
Chol/HDL Ratio: 2.9 ratio (ref 0.0–5.0)
Cholesterol, Total: 149 mg/dL (ref 100–199)
HDL: 51 mg/dL (ref >39)
LDL Chol Calc (NIH): 72 mg/dL (ref 0–99)
Triglycerides: 149 mg/dL (ref 0–149)
VLDL Cholesterol Cal: 26 mg/dL (ref 5–40)

## 2020-09-01 NOTE — Progress Notes (Signed)
Darin Pitts Date of Birth  1956/07/08       [facility] Office [address], [address] [place], [place]  [address] [phone]   Fax  [phone]    Problem List: 1. Coronary artery disease-status post CABG by Dr. Novicki Moras in November, 2010 2. Diabetes Mellitus 3. Hyperlipidemia    Darin Pitts is a 64 y.o. gentleman with a history of coronary artery disease. He has not had any difficulty since his bypass grafting in 2010. 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  He tries to eat a low fat diet.  Nov. 3, 2014:  Lemon has been seen by Darin Pitts since I last saw him.   No complaints.  He still works as a Naval architect for [employer].    He is exercising regularly.   Nov. 9, 2015:  Darin Pitts is doing well.  No angina.  Working out regularly. No issues. Doing well  is fasting for lab work  Nov. 14, 2016:   Doing great . Works out regularly , no CP    Nov. 20, 2017:  Darin Pitts is seen for follow up visit . Working out regularly. No CP or dyspnea.   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   October 18, 2017:  Doing well .   Working out regularly .   October 22, 2018: Darin Pitts  is seen today for a follow-up visit. History of coronary artery disease and is status post coronary artery bypass grafting in 2010.  He has a history of hyperlipidemia. Works out regularly  Recent labs look great   October 08, 2019:  Darin Pitts is seen today for follow-up visit.  He has a history of CAD and status post CABG in 2010.  He has a history of hyperlipidemia. No cp or dyspnea  Works out regularly .    Drives for [employer]  He brought a note from Dr. Kathrynn Running requesting exercise treadmill test and an updated ejection fraction which is apparently needed every 5 years.  He also is requesting a letter of cardiac clearance for Darin Pitts.  Oct. 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  Still working out No angina , no limitations with working out    Current Outpatient Medications on File Prior to Visit  Medication Sig Dispense Refill  . aspirin EC 81 MG tablet Take 1 tablet (81 mg total) by mouth daily.    Marland Kitchen atorvastatin (LIPITOR) 20 MG tablet Take 1 tablet (20 mg total) by mouth daily at 6 PM. 90 tablet 2  . KOMBIGLYZE XR 5-500 MG TB24 TAKE 1 TABLET BY MOUTH EVERY DAY 30 tablet 5  . Lancets (UNILET COMFORTOUCH LANCET) MISC Use as directed to check blood sugars 1 time per day dx: e11.65 100 each 1  . metoprolol tartrate (LOPRESSOR) 25 MG tablet TAKE 1 TABLET BY MOUTH TWICE A DAY 180 tablet 3  . Multiple Vitamin (MULTIVITAMIN) tablet Take 1 tablet by mouth daily.      Letta Pate ULTRA test strip CHECK BLOOD SUGAR EVERY DAY 100 strip 3   No current facility-administered medications on file prior to visit.    No  Known Allergies  Past Medical History:  Diagnosis Date  . CAD (coronary artery disease) 2010   CABG x 1  DR. OWEN  . Hyperglycemia     Past Surgical History:  Procedure Laterality Date  . CORONARY ARTERY BYPASS GRAFT  2010  . HEMORRHOID SURGERY  2002    Social History   Tobacco Use  Smoking Status Never Smoker  Smokeless Tobacco Never Used    Social History   Substance and Sexual Activity  Alcohol Use No    Family History  Problem Relation Age of Onset  . Hypertension Father   . Diabetes Father   . Hypertension Mother   . Stroke Mother   . Diabetes Mother   . Alzheimer's disease Mother   . Hypertension Brother   . Diabetes Brother     Reviw of Systems:  Reviewed in the HPI.  All other systems are negative.    Physical Exam: Blood pressure 110/76, pulse 80, height 5\' 10"  (1.778 m), weight 218 lb 6.4 oz (99.1 kg), SpO2 97 %.  GEN:  Well nourished, well  developed in no acute distress HEENT: Normal NECK: No JVD; No carotid bruits LYMPHATICS: No lymphadenopathy CARDIAC: RRR , no murmurs, rubs, gallops RESPIRATORY:  Clear to auscultation without rales, wheezing or rhonchi  ABDOMEN: Soft, non-tender, non-distended MUSCULOSKELETAL:  No edema; No deformity  SKIN: Warm and dry NEUROLOGIC:  Alert and oriented x 3    ECG:  Oct. 26, 2021: Normal sinus rhythm at 80.  No ST or T wave changes.   Assessment / Plan:    1. Coronary artery disease-   Darin Pitts  is doing well.  He status post coronary artery bypass grafting in 2010.  He had a stress test from January 2019 which was normal.  He exercised for 7 minutes and had no episodes of angina.  He achieved a peak heart rate of 169 which is 106% predicted maximal heart rate.  His blood pressure response was slightly hypertensive.  This the treadmill test was normal.  He had an echocardiogram from January 2018 which revealed normal left ventricular systolic function.  His ejection fraction is 55 to 60%.  He had mild aortic insufficiency.  He is very stable.  No angina.   Works out regularly .    2. Diabetes Mellitus -  Managed by his primary MD   3. Hyperlipidemia-  Lipids in Dec. 2020 look good.  Repeat today  Cont atorvastatin .  Repeat labs in 1 year.     03[birth year]08-1979, MD  09/01/2020 5:15 PM    [facility] [address] [place],  [address] [place], [place]  [place] Pager [phone] Phone: [phone]; Fax: [phone]

## 2020-09-01 NOTE — Patient Instructions (Signed)
Medication Instructions:  Your physician recommends that you continue on your current medications as directed. Please refer to the Current Medication list given to you today.  *If you need a refill on your cardiac medications before your next appointment, please call your pharmacy*  Lab Work: Your physician recommends that you have fasting lipid panel today. Your physician recommends that you return for lab work in: 1 year for fasting lipid and liver panel and BMET.  If you have labs (blood work) drawn today and your tests are completely normal, you will receive your results only by: Marland Kitchen MyChart Message (if you have MyChart) OR . A paper copy in the mail If you have any lab test that is abnormal or we need to change your treatment, we will call you to review the results.  Follow-Up: At [facility], you and your health needs are our priority.  As part of our continuing mission to provide you with exceptional heart care, we have created designated Provider Care Teams.  These Care Teams include your primary Cardiologist (physician) and Advanced Practice Providers (APPs -  Physician Assistants and Nurse Practitioners) who all work together to provide you with the care you need, when you need it.  We recommend signing up for the patient portal called "MyChart".  Sign up information is provided on this After Visit Summary.  MyChart is used to connect with patients for Virtual Visits (Telemedicine).  Patients are able to view lab/test results, encounter notes, upcoming appointments, etc.  Non-urgent messages can be sent to your provider as well.   To learn more about what you can do with MyChart, go to [email or web address].    Your next appointment:   1 year(s)  The format for your next appointment:   In Person  Provider:   You may see Kristeen Miss, MD or one of the following Advanced Practice Providers on your designated Care Team:    Tereso Newcomer, PA-C  [place]in [place], [place]

## 2020-09-07 ENCOUNTER — Telehealth: Payer: Self-pay | Admitting: Cardiovascular Disease

## 2020-09-07 DIAGNOSIS — I25810 Atherosclerosis of coronary artery bypass graft(s) without angina pectoris: Secondary | ICD-10-CM

## 2020-09-07 LAB — HM DIABETES EYE EXAM

## 2020-09-07 NOTE — Telephone Encounter (Signed)
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 I do not see an order.

## 2020-09-07 NOTE — Telephone Encounter (Signed)
Follow Up:     Pt said he talked to you earlier this morning, he needs to tallk to you again please.

## 2020-09-07 NOTE — Telephone Encounter (Signed)
Patient had a recent OV on 10/26 with Dr. Elease Hashimoto.   Excerpt from recent OV note: Oct. 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   Will route to Dr. Elease Hashimoto for advisement.

## 2020-09-08 NOTE — Telephone Encounter (Signed)
Called and offered the patient an echo this afternoon but he declined. He understands an order will be placed for him to have the test at the hospital (there is currently no other availability before 11/15 at the office). He understands the schedulers will call to arrange. He was grateful for call.

## 2020-09-08 NOTE — Telephone Encounter (Signed)
The patient is scheduled for echo 11/5.

## 2020-09-08 NOTE — Telephone Encounter (Signed)
Patient is following up. Please call

## 2020-09-08 NOTE — Telephone Encounter (Signed)
Pt needs an echo to be done for his DCL. Please order an echo to be done at the [address] office of the [facility] . He needs this done prior to Nov. 15.  If it cant be completed by that time. We will get a myoview  Let me know if we need to change   Thanks  Michele Mcalpine

## 2020-09-11 ENCOUNTER — Ambulatory Visit (HOSPITAL_COMMUNITY)
Admission: RE | Admit: 2020-09-11 | Discharge: 2020-09-11 | Disposition: A | Discharge status: Home / Self Care | Payer: 59 | Source: Ambulatory Visit | Attending: Cardiovascular Disease | Admitting: Cardiovascular Disease

## 2020-09-11 ENCOUNTER — Telehealth: Payer: Self-pay

## 2020-09-11 ENCOUNTER — Other Ambulatory Visit: Payer: Self-pay

## 2020-09-11 DIAGNOSIS — I25810 Atherosclerosis of coronary artery bypass graft(s) without angina pectoris: Secondary | ICD-10-CM | POA: Diagnosis present

## 2020-09-11 DIAGNOSIS — I351 Nonrheumatic aortic (valve) insufficiency: Secondary | ICD-10-CM | POA: Insufficient documentation

## 2020-09-11 LAB — ECHOCARDIOGRAM COMPLETE
Area-P 1/2: 5.13 cm2
P 1/2 time: 506 msec
S' Lateral: 2.9 cm

## 2020-09-11 NOTE — Telephone Encounter (Addendum)
Pt called back to report that the forms are for an Echo OR stress test... 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  Pts wife advised pt Echo result and the form has been faxed.

## 2020-09-11 NOTE — Telephone Encounter (Signed)
Pt returning a call from our office about a stress test

## 2020-09-11 NOTE — Progress Notes (Signed)
  Echocardiogram 2D Echocardiogram has been performed.  Darin Pitts 09/11/2020, 8:40 AM

## 2020-09-11 NOTE — Telephone Encounter (Signed)
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 [facility]... will have his form scanned Epic.

## 2020-09-11 NOTE — Telephone Encounter (Addendum)
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 Elease Hashimoto... he needs the results of an updated Echo done today 09/11/20 but he also has an exercise stress test checked off as being needed but his last one was 11/10/17.    LM for him to call our office back to discuss... pt does not have one currently ordered.   Form has been placed in Dr. Harvie Bridge mailbox waiting for the pt to call back and the results of his Echo.

## 2020-09-13 ENCOUNTER — Other Ambulatory Visit: Payer: Self-pay | Admitting: Internal Medicine

## 2020-09-16 ENCOUNTER — Telehealth: Payer: Self-pay

## 2020-09-16 NOTE — Telephone Encounter (Signed)
-----   Message from Philip J Nahser, MD sent at 09/11/2020  5:14 PM EDT ----- Echo shows normal LV systolic function. Grade 1 diastolic dysfunction .  No significant valvular disease.   

## 2020-09-16 NOTE — Telephone Encounter (Signed)
The patient has been notified of the result and verbalized understanding.  All questions (if any) were answered. Leanord Hawking, RN 09/16/2020 9:36 AM

## 2020-09-16 NOTE — Telephone Encounter (Signed)
-----   Message from Vesta Mixer, MD sent at 09/11/2020  5:14 PM EDT ----- Echo shows normal LV systolic function. Grade 1 diastolic dysfunction .  No significant valvular disease.

## 2020-09-16 NOTE — Telephone Encounter (Signed)
Left the patient a message to call the office back regarding results.  

## 2020-11-10 ENCOUNTER — Encounter: Payer: Self-pay | Admitting: Internal Medicine

## 2020-11-10 ENCOUNTER — Other Ambulatory Visit: Payer: Self-pay

## 2020-11-10 ENCOUNTER — Ambulatory Visit (INDEPENDENT_AMBULATORY_CARE_PROVIDER_SITE_OTHER): Payer: 59 | Admitting: Internal Medicine

## 2020-11-10 VITALS — BP 126/70 | HR 91 | Temp 98.2°F | Ht 70.0 in | Wt 217.8 lb

## 2020-11-10 DIAGNOSIS — I119 Hypertensive heart disease without heart failure: Secondary | ICD-10-CM | POA: Diagnosis not present

## 2020-11-10 DIAGNOSIS — E1169 Type 2 diabetes mellitus with other specified complication: Secondary | ICD-10-CM

## 2020-11-10 DIAGNOSIS — I25810 Atherosclerosis of coronary artery bypass graft(s) without angina pectoris: Secondary | ICD-10-CM | POA: Diagnosis not present

## 2020-11-10 DIAGNOSIS — Z0000 Encounter for general adult medical examination without abnormal findings: Secondary | ICD-10-CM | POA: Diagnosis not present

## 2020-11-10 DIAGNOSIS — E785 Hyperlipidemia, unspecified: Secondary | ICD-10-CM | POA: Diagnosis not present

## 2020-11-10 LAB — POCT URINALYSIS DIPSTICK
Bilirubin, UA: NEGATIVE
Blood, UA: NEGATIVE
Glucose, UA: NEGATIVE
Ketones, UA: NEGATIVE
Leukocytes, UA: NEGATIVE
Nitrite, UA: NEGATIVE
Protein, UA: NEGATIVE
Spec Grav, UA: 1.025 (ref 1.010–1.025)
Urobilinogen, UA: 0.2 E.U./dL
pH, UA: 5.5 (ref 5.0–8.0)

## 2020-11-10 LAB — POCT UA - MICROALBUMIN
Albumin/Creatinine Ratio, Urine, POC: 30
Creatinine, POC: 300 mg/dL
Microalbumin Ur, POC: 10 mg/L

## 2020-11-10 NOTE — Patient Instructions (Signed)

## 2020-11-10 NOTE — Progress Notes (Signed)
I,Tianna Badgett,acting as a Education administrator for Maximino Greenland, MD.,have documented all relevant documentation on the behalf of Maximino Greenland, MD,as directed by  Maximino Greenland, MD while in the presence of Maximino Greenland, MD.  This visit occurred during the SARS-CoV-2 public health emergency.  Safety protocols were in place, including screening questions prior to the visit, additional usage of staff PPE, and extensive cleaning of exam room while observing appropriate contact time as indicated for disinfecting solutions.  Subjective:     Patient ID: Darin Pitts , male    DOB: 06-11-1956 , 65 y.o.   MRN: [id number]   Chief Complaint  Patient presents with  . Annual Exam  . Diabetes  . Hypertension    HPI  He is here today for a full physical examination. He reports compliance with meds. He has no specific concerns or complaints at this time. He denies headaches, chest pain and shortness of breath.   Diabetes He presents for his follow-up diabetic visit. He has type 2 diabetes mellitus. His disease course has been improving. There are no hypoglycemic associated symptoms. There are no diabetic associated symptoms. Pertinent negatives for diabetes include no blurred vision. There are no hypoglycemic complications. Diabetic complications include heart disease. Risk factors for coronary artery disease include diabetes mellitus, dyslipidemia, hypertension and male sex. He is compliant with treatment most of the time. He is following a diabetic diet. He participates in exercise three times a week. Eye exam is current.  Hypertension This is a chronic problem. The current episode started more than 1 year ago. The problem has been gradually improving since onset. The problem is controlled. Pertinent negatives include no blurred vision or palpitations. Risk factors for coronary artery disease include diabetes mellitus, dyslipidemia, obesity and male gender. Past treatments include beta blockers. The  current treatment provides moderate improvement. Hypertensive end-organ damage includes CAD/MI.     Past Medical History:  Diagnosis Date  . CAD (coronary artery disease) 2010   CABG x 1  DR. OWEN  . Hyperglycemia      Family History  Problem Relation Age of Onset  . Hypertension Father   . Diabetes Father   . Hypertension Mother   . Stroke Mother   . Diabetes Mother   . Alzheimer's disease Mother   . Hypertension Brother   . Diabetes Brother      Current Outpatient Medications:  .  aspirin EC 81 MG tablet, Take 1 tablet (81 mg total) by mouth daily., Disp: , Rfl:  .  atorvastatin (LIPITOR) 20 MG tablet, TAKE 1 TABLET (20 MG TOTAL) BY MOUTH DAILY AT 6 PM., Disp: 90 tablet, Rfl: 2 .  KOMBIGLYZE XR 5-500 MG TB24, TAKE 1 TABLET BY MOUTH EVERY DAY, Disp: 30 tablet, Rfl: 5 .  Lancets (UNILET COMFORTOUCH LANCET) MISC, Use as directed to check blood sugars 1 time per day dx: e11.65, Disp: 100 each, Rfl: 1 .  metoprolol tartrate (LOPRESSOR) 25 MG tablet, TAKE 1 TABLET BY MOUTH TWICE A DAY, Disp: 180 tablet, Rfl: 3 .  Multiple Vitamin (MULTIVITAMIN) tablet, Take 1 tablet by mouth daily., Disp: , Rfl:  .  ONETOUCH ULTRA test strip, CHECK BLOOD SUGAR EVERY DAY, Disp: 100 strip, Rfl: 3   No Known Allergies    . The patient's tobacco use is:  Social History   Tobacco Use  Smoking Status Never Smoker  Smokeless Tobacco Never Used  . She has been exposed to passive smoke. The patient's alcohol use is:  Social History   Substance and Sexual Activity  Alcohol Use No   Review of Systems  Constitutional: Negative.   HENT: Negative.   Eyes: Negative.  Negative for blurred vision.  Respiratory: Negative.   Cardiovascular: Negative.  Negative for palpitations.  Gastrointestinal: Negative.   Endocrine: Negative.   Genitourinary: Negative.   Musculoskeletal: Negative.   Skin: Negative.   Allergic/Immunologic: Negative.   Neurological: Negative.   Hematological: Negative.    Psychiatric/Behavioral: Negative.      Today's Vitals   11/10/20 1424  BP: 126/70  Pulse: 91  Temp: 98.2 F (36.8 C)  TempSrc: Oral  Weight: 217 lb 12.8 oz (98.8 kg)  Height: _0  (1.778 m)   Body mass index is 31.25 kg/m.   Objective:  Physical Exam Vitals and nursing note reviewed. Exam conducted with a chaperone present.  Constitutional:      Appearance: Normal appearance.  HENT:     Head: Normocephalic and atraumatic.     Right Ear: Tympanic membrane, ear canal and external ear normal.     Left Ear: Tympanic membrane, ear canal and external ear normal.     Nose:     Comments: Deferred, masked    Mouth/Throat:     Comments: Deferred, masked Eyes:     Extraocular Movements: Extraocular movements intact.     Conjunctiva/sclera: Conjunctivae normal.     Pupils: Pupils are equal, round, and reactive to light.  Cardiovascular:     Rate and Rhythm: Normal rate and regular rhythm.     Pulses: Normal pulses.          Dorsalis pedis pulses are 2+ on the right side and 2+ on the left side.     Heart sounds: Normal heart sounds.  Pulmonary:     Effort: Pulmonary effort is normal.     Breath sounds: Normal breath sounds.  Chest:  Breasts:     Right: Normal. No swelling, bleeding, inverted nipple, mass or nipple discharge.     Left: Normal. No swelling, bleeding, inverted nipple, mass or nipple discharge.      Comments: Healed midline surgical scar Abdominal:     General: Abdomen is flat. Bowel sounds are normal.     Palpations: Abdomen is soft.  Genitourinary:    Prostate: Normal.     Rectum: Normal. Guaiac result negative.  Musculoskeletal:        General: Normal range of motion.     Cervical back: Normal range of motion and neck supple.  Feet:     Right foot:     Protective Sensation: 5 sites tested. 5 sites sensed.     Skin integrity: Dry skin present.     Toenail Condition: Right toenails are normal.     Left foot:     Protective Sensation: 5 sites tested.  5 sites sensed.     Skin integrity: Dry skin present.     Toenail Condition: Left toenails are normal.  Skin:    General: Skin is warm.  Neurological:     General: No focal deficit present.     Mental Status: He is alert.  Psychiatric:        Mood and Affect: Mood normal.        Behavior: Behavior normal.         Assessment And Plan:     1. Routine general medical examination at health care facility Comments: A full exam was performed. DRE performed, stool heme negative. PATIENT IS ADVISED TO GET 30-45 MINUTES  REGULAR EXERCISE NO LESS THAN FOUR TO FIVE DAYS PER WEEK - BOTH WEIGHTBEARING EXERCISES AND AEROBIC ARE RECOMMENDED.  PATIENT IS ADVISED TO FOLLOW A HEALTHY DIET WITH AT LEAST SIX FRUITS/VEGGIES PER DAY, DECREASE INTAKE OF RED MEAT, AND TO INCREASE FISH INTAKE TO TWO DAYS PER WEEK.  MEATS/FISH SHOULD NOT BE FRIED, BAKED OR BROILED IS PREFERABLE.  I SUGGEST WEARING SPF 50 SUNSCREEN ON EXPOSED PARTS AND ESPECIALLY WHEN IN THE DIRECT SUNLIGHT FOR AN EXTENDED PERIOD OF TIME.  PLEASE AVOID FAST FOOD RESTAURANTS AND INCREASE YOUR WATER INTAKE. - CBC - Hemoglobin A1c - CMP14+EGFR - PSA - HIV Antibody (routine testing w rflx) - POC Hemoccult Bld/Stl (1-Cd Office Dx)  2. Type 2 diabetes mellitus with hyperlipidemia ([facility]) Comments: Diabetic foot exam was performed.   I plan to switch him to [place] 5/500 once daily. I will make additional recommendations once labs ready for review. I DISCUSSED WITH THE PATIENT AT LENGTH REGARDING THE GOALS OF GLYCEMIC CONTROL AND POSSIBLE LONG-TERM COMPLICATIONS.  I  ALSO STRESSED THE IMPORTANCE OF COMPLIANCE WITH HOME GLUCOSE MONITORING, DIETARY RESTRICTIONS INCLUDING AVOIDANCE OF SUGARY DRINKS/PROCESSED FOODS,  ALONG WITH REGULAR EXERCISE.  I  ALSO STRESSED THE IMPORTANCE OF ANNUAL EYE EXAMS, SELF FOOT CARE AND COMPLIANCE WITH OFFICE VISITS.  - POCT Urinalysis Dipstick (81002) - POCT UA - Microalbumin  3. Benign hypertensive heart disease without heart  failure Comments: Chronic, well controlled. He will continue with current meds. He is encouraged to avoid adding salt to his foods. He is encouraged to have less than 1564m/day.  He will f/u in 4 months for re-evaluation.   4. Coronary artery disease involving coronary bypass graft of native heart without angina pectoris Comments: Chronic, stable. He is not experiencing any angina at this time. Encouraged to follow heart healthy diet.   Patient was given opportunity to ask questions. Patient verbalized understanding of the plan and was able to repeat key elements of the plan. All questions were answered to their satisfaction.  RMaximino Greenland MD   I, RMaximino Greenland MD, have reviewed all documentation for this visit. The documentation on 11/14/20 for the exam, diagnosis, procedures, and orders are all accurate and complete.  THE PATIENT IS ENCOURAGED TO PRACTICE SOCIAL DISTANCING DUE TO THE COVID-19 PANDEMIC.

## 2020-11-11 LAB — CMP14+EGFR
ALT: 40 IU/L (ref 0–44)
AST: 34 IU/L (ref 0–40)
Albumin/Globulin Ratio: 1.8 (ref 1.2–2.2)
Albumin: 4.6 g/dL (ref 3.8–4.8)
Alkaline Phosphatase: 45 IU/L (ref 44–121)
BUN/Creatinine Ratio: 9 — ABNORMAL LOW (ref 10–24)
BUN: 11 mg/dL (ref 8–27)
Bilirubin Total: 0.4 mg/dL (ref 0.0–1.2)
CO2: 24 mmol/L (ref 20–29)
Calcium: 9.6 mg/dL (ref 8.6–10.2)
Chloride: 105 mmol/L (ref 96–106)
Creatinine, Ser: 1.19 mg/dL (ref 0.76–1.27)
GFR calc Af Amer: 74 mL/min/{1.73_m2} (ref >59)
GFR calc non Af Amer: 64 mL/min/{1.73_m2} (ref >59)
Globulin, Total: 2.6 g/dL (ref 1.5–4.5)
Glucose: 100 mg/dL — ABNORMAL HIGH (ref 65–99)
Potassium: 4 mmol/L (ref 3.5–5.2)
Sodium: 142 mmol/L (ref 134–144)
Total Protein: 7.2 g/dL (ref 6.0–8.5)

## 2020-11-11 LAB — PSA: Prostate Specific Ag, Serum: 0.4 ng/mL (ref 0.0–4.0)

## 2020-11-11 LAB — CBC
Hematocrit: 42 % (ref 37.5–51.0)
Hemoglobin: 14 g/dL (ref 13.0–17.7)
MCH: 29.5 pg (ref 26.6–33.0)
MCHC: 33.3 g/dL (ref 31.5–35.7)
MCV: 89 fL (ref 79–97)
Platelets: 198 10*3/uL (ref 150–450)
RBC: 4.74 x10E6/uL (ref 4.14–5.80)
RDW: 13.1 % (ref 11.6–15.4)
WBC: 8 10*3/uL (ref 3.4–10.8)

## 2020-11-11 LAB — HEMOGLOBIN A1C
Est. average glucose Bld gHb Est-mCnc: 169 mg/dL
Hgb A1c MFr Bld: 7.5 % — ABNORMAL HIGH (ref 4.8–5.6)

## 2020-11-11 LAB — HIV ANTIBODY (ROUTINE TESTING W REFLEX): HIV Screen 4th Generation wRfx: NONREACTIVE

## 2020-11-14 ENCOUNTER — Encounter: Payer: Self-pay | Admitting: Internal Medicine

## 2020-11-14 LAB — POC HEMOCCULT BLD/STL (OFFICE/1-CARD/DIAGNOSTIC)
Card #1 Date: 1042022
Fecal Occult Blood, POC: NEGATIVE

## 2020-12-15 ENCOUNTER — Other Ambulatory Visit: Payer: Self-pay

## 2020-12-15 MED ORDER — METOPROLOL TARTRATE 25 MG PO TABS
25.0000 mg | ORAL_TABLET | Freq: Two times a day (BID) | ORAL | 3 refills | Status: DC
Start: 2020-12-15 — End: 2021-12-22

## 2020-12-20 ENCOUNTER — Other Ambulatory Visit: Payer: Self-pay | Admitting: Internal Medicine

## 2021-02-08 ENCOUNTER — Other Ambulatory Visit: Payer: Self-pay

## 2021-02-08 ENCOUNTER — Encounter: Payer: Self-pay | Admitting: Internal Medicine

## 2021-02-08 ENCOUNTER — Ambulatory Visit: Payer: 59 | Admitting: Internal Medicine

## 2021-02-08 VITALS — BP 116/74 | HR 73 | Temp 97.6°F | Ht 70.0 in | Wt 211.8 lb

## 2021-02-08 DIAGNOSIS — I25810 Atherosclerosis of coronary artery bypass graft(s) without angina pectoris: Secondary | ICD-10-CM

## 2021-02-08 DIAGNOSIS — Z23 Encounter for immunization: Secondary | ICD-10-CM | POA: Diagnosis not present

## 2021-02-08 DIAGNOSIS — E1169 Type 2 diabetes mellitus with other specified complication: Secondary | ICD-10-CM

## 2021-02-08 DIAGNOSIS — E785 Hyperlipidemia, unspecified: Secondary | ICD-10-CM

## 2021-02-08 DIAGNOSIS — Z6830 Body mass index (BMI) 30.0-30.9, adult: Secondary | ICD-10-CM

## 2021-02-08 DIAGNOSIS — I119 Hypertensive heart disease without heart failure: Secondary | ICD-10-CM

## 2021-02-08 DIAGNOSIS — E6609 Other obesity due to excess calories: Secondary | ICD-10-CM

## 2021-02-08 LAB — BMP8+EGFR
BUN/Creatinine Ratio: 12 (ref 10–24)
BUN: 11 mg/dL (ref 8–27)
CO2: 20 mmol/L (ref 20–29)
Calcium: 9.8 mg/dL (ref 8.6–10.2)
Chloride: 106 mmol/L (ref 96–106)
Creatinine, Ser: 0.93 mg/dL (ref 0.76–1.27)
Glucose: 164 mg/dL — ABNORMAL HIGH (ref 65–99)
Potassium: 4.5 mmol/L (ref 3.5–5.2)
Sodium: 142 mmol/L (ref 134–144)
eGFR: 91 mL/min/{1.73_m2} (ref >59)

## 2021-02-08 LAB — HEMOGLOBIN A1C
Est. average glucose Bld gHb Est-mCnc: 171 mg/dL
Hgb A1c MFr Bld: 7.6 % — ABNORMAL HIGH (ref 4.8–5.6)

## 2021-02-08 MED ORDER — XIGDUO XR 10-500 MG PO TB24: 1.0000 | ORAL_TABLET | Freq: Every day | ORAL | 1 refills | Status: DC

## 2021-02-08 NOTE — Patient Instructions (Signed)
Diabetes Mellitus and Foot Care Foot care is an important part of your health, especially when you have diabetes. Diabetes may cause you to have problems because of poor blood flow (circulation) to your feet and legs, which can cause your skin to:  Become thinner and drier.  Break more easily.  Heal more slowly.  Peel and crack. You may also have nerve damage (neuropathy) in your legs and feet, causing decreased feeling in them. This means that you may not notice minor injuries to your feet that could lead to more serious problems. Noticing and addressing any potential problems early is the best way to prevent future foot problems. How to care for your feet Foot hygiene  Wash your feet daily with warm water and mild soap. Do not use hot water. Then, pat your feet and the areas between your toes until they are completely dry. Do not soak your feet as this can dry your skin.  Trim your toenails straight across. Do not dig under them or around the cuticle. File the edges of your nails with an emery board or nail file.  Apply a moisturizing lotion or petroleum jelly to the skin on your feet and to dry, brittle toenails. Use lotion that does not contain alcohol and is unscented. Do not apply lotion between your toes.   Shoes and socks  Wear clean socks or stockings every day. Make sure they are not too tight. Do not wear knee-high stockings since they may decrease blood flow to your legs.  Wear shoes that fit properly and have enough cushioning. Always look in your shoes before you put them on to be sure there are no objects inside.  To break in new shoes, wear them for just a few hours a day. This prevents injuries on your feet. Wounds, scrapes, corns, and calluses  Check your feet daily for blisters, cuts, bruises, sores, and redness. If you cannot see the bottom of your feet, use a mirror or ask someone for help.  Do not cut corns or calluses or try to remove them with medicine.  If you  find a minor scrape, cut, or break in the skin on your feet, keep it and the skin around it clean and dry. You may clean these areas with mild soap and water. Do not clean the area with peroxide, alcohol, or iodine.  If you have a wound, scrape, corn, or callus on your foot, look at it several times a day to make sure it is healing and not infected. Check for: ? Redness, swelling, or pain. ? Fluid or blood. ? Warmth. ? Pus or a bad smell.   General tips  Do not cross your legs. This may decrease blood flow to your feet.  Do not use heating pads or hot water bottles on your feet. They may burn your skin. If you have lost feeling in your feet or legs, you may not know this is happening until it is too late.  Protect your feet from hot and cold by wearing shoes, such as at the beach or on hot pavement.  Schedule a complete foot exam at least once a year (annually) or more often if you have foot problems. Report any cuts, sores, or bruises to your health care provider immediately. Where to find more information  American Diabetes Association: www.diabetes.org  Association of Diabetes Care & Education Specialists: www.diabeteseducator.org Contact a health care provider if:  You have a medical condition that increases your risk of infection and   you have any cuts, sores, or bruises on your feet.  You have an injury that is not healing.  You have redness on your legs or feet.  You feel burning or tingling in your legs or feet.  You have pain or cramps in your legs and feet.  Your legs or feet are numb.  Your feet always feel cold.  You have pain around any toenails. Get help right away if:  You have a wound, scrape, corn, or callus on your foot and: ? You have pain, swelling, or redness that gets worse. ? You have fluid or blood coming from the wound, scrape, corn, or callus. ? Your wound, scrape, corn, or callus feels warm to the touch. ? You have pus or a bad smell coming from  the wound, scrape, corn, or callus. ? You have a fever. ? You have a red line going up your leg. Summary  Check your feet every day for blisters, cuts, bruises, sores, and redness.  Apply a moisturizing lotion or petroleum jelly to the skin on your feet and to dry, brittle toenails.  Wear shoes that fit properly and have enough cushioning.  If you have foot problems, report any cuts, sores, or bruises to your health care provider immediately.  Schedule a complete foot exam at least once a year (annually) or more often if you have foot problems. This information is not intended to replace advice given to you by your health care provider. Make sure you discuss any questions you have with your health care provider. Document Revised: 05/14/2020 Document Reviewed: 05/14/2020 Elsevier Patient Education  2021 [employer] Inc.  

## 2021-02-08 NOTE — Progress Notes (Signed)
I,Katawbba Wiggins,acting as a Education administrator for Maximino Greenland, MD.,have documented all relevant documentation on the behalf of Maximino Greenland, MD,as directed by  Maximino Greenland, MD while in the presence of Maximino Greenland, MD.  This visit occurred during the SARS-CoV-2 public health emergency.  Safety protocols were in place, including screening questions prior to the visit, additional usage of staff PPE, and extensive cleaning of exam room while observing appropriate contact time as indicated for disinfecting solutions.  Subjective:     Patient ID: Darin Pitts , male    DOB: Feb 20, 1956 , 65 y.o.   MRN: [id number]   Chief Complaint  Patient presents with  . Diabetes  . Hypertension    HPI  He presents today for DM/HTN f/u.  He reports compliance with meds. He denies headaches, chest pain and shortness of breath.   Diabetes He presents for his follow-up diabetic visit. He has type 2 diabetes mellitus. His disease course has been improving. There are no hypoglycemic associated symptoms. There are no diabetic associated symptoms. Pertinent negatives for diabetes include no blurred vision. There are no hypoglycemic complications. Diabetic complications include heart disease. Risk factors for coronary artery disease include diabetes mellitus, dyslipidemia, hypertension and male sex. He is compliant with treatment most of the time. He is following a diabetic diet. He participates in exercise three times a week. Eye exam is current.  Hypertension This is a chronic problem. The current episode started more than 1 year ago. The problem has been gradually improving since onset. The problem is controlled. Pertinent negatives include no blurred vision or palpitations. Risk factors for coronary artery disease include diabetes mellitus, dyslipidemia, obesity and male gender. Past treatments include beta blockers. The current treatment provides moderate improvement. Hypertensive end-organ damage includes  CAD/MI.     Past Medical History:  Diagnosis Date  . CAD (coronary artery disease) 2010   CABG x 1  DR. OWEN  . Hyperglycemia      Family History  Problem Relation Age of Onset  . Hypertension Father   . Diabetes Father   . Hypertension Mother   . Stroke Mother   . Diabetes Mother   . Alzheimer's disease Mother   . Hypertension Brother   . Diabetes Brother      Current Outpatient Medications:  .  aspirin EC 81 MG tablet, Take 1 tablet (81 mg total) by mouth daily., Disp: , Rfl:  .  atorvastatin (LIPITOR) 20 MG tablet, TAKE 1 TABLET (20 MG TOTAL) BY MOUTH DAILY AT 6 PM., Disp: 90 tablet, Rfl: 2 .  Dapagliflozin-metFORMIN HCl ER (XIGDUO XR) 10-500 MG TB24, Take 1 tablet by mouth daily., Disp: 30 tablet, Rfl: 1 .  Lancets (UNILET COMFORTOUCH LANCET) MISC, Use as directed to check blood sugars 1 time per day dx: e11.65, Disp: 100 each, Rfl: 1 .  metoprolol tartrate (LOPRESSOR) 25 MG tablet, Take 1 tablet (25 mg total) by mouth 2 (two) times daily., Disp: 180 tablet, Rfl: 3 .  Multiple Vitamin (MULTIVITAMIN) tablet, Take 1 tablet by mouth daily., Disp: , Rfl:  .  ONETOUCH ULTRA test strip, CHECK BLOOD SUGAR EVERY DAY, Disp: 100 strip, Rfl: 3   No Known Allergies   Review of Systems  Constitutional: Negative.   Eyes: Negative for blurred vision.  Respiratory: Negative.   Cardiovascular: Negative.  Negative for palpitations.  Gastrointestinal: Negative.   Psychiatric/Behavioral: Negative.   All other systems reviewed and are negative.    Today's Vitals   02/08/21 1010  BP: 116/74  Pulse: 73  Temp: 97.6 F (36.4 C)  TempSrc: Oral  Weight: 211 lb 12.8 oz (96.1 kg)  Height: 5' 10"  (1.778 m)   Body mass index is 30.39 kg/m.  Wt Readings from Last 3 Encounters:  02/08/21 211 lb 12.8 oz (96.1 kg)  11/10/20 217 lb 12.8 oz (98.8 kg)  09/01/20 218 lb 6.4 oz (99.1 kg)   Objective:  Physical Exam Vitals and nursing note reviewed.  Constitutional:      Appearance: Normal  appearance.  HENT:     Head: Normocephalic and atraumatic.     Nose:     Comments: Masked     Mouth/Throat:     Comments: Masked  Eyes:     Extraocular Movements: Extraocular movements intact.  Cardiovascular:     Rate and Rhythm: Normal rate and regular rhythm.     Heart sounds: Normal heart sounds.  Pulmonary:     Effort: Pulmonary effort is normal.     Breath sounds: Normal breath sounds.  Musculoskeletal:     Cervical back: Normal range of motion.  Skin:    General: Skin is warm.  Neurological:     General: No focal deficit present.     Mental Status: He is alert.  Psychiatric:        Mood and Affect: Mood normal.         Assessment And Plan:     1. Type 2 diabetes mellitus with hyperlipidemia ([facility]) Comments: Unfortunately, his insurance does not cover Jardiance. I will try him on Xigduo 5/538m daily. He was given rx w/ savings card. He will f/u 4 weeks.  - BMP8+EGFR - Hemoglobin A1c  2. Benign hypertensive heart disease without heart failure Comments: Chronic, well controlled. He is encouraged to follow low sodium diet.  - BMP8+EGFR  3. Coronary artery disease involving coronary bypass graft of native heart without angina pectoris Comments: Chronic, encouraged to follow heart healthy lifestyle including regular exercise, clean eating and med compliance.   4. Class 1 obesity due to excess calories with serious comorbidity and body mass index (BMI) of 30.0 to 30.9 in adult Comments: He is encouraged to aim for at least 150 minutes of exercise per week.   5. Immunization due Comments: He was given pneumovax-23 to update his immunization history.    Patient was given opportunity to ask questions. Patient verbalized understanding of the plan and was able to repeat key elements of the plan. All questions were answered to their satisfaction.   I, RMaximino Greenland MD, have reviewed all documentation for this visit. The documentation on 02/08/21 for the exam, diagnosis,  procedures, and orders are all accurate and complete.   IF YOU HAVE BEEN REFERRED TO A SPECIALIST, IT MAY TAKE 1-2 WEEKS TO SCHEDULE/PROCESS THE REFERRAL. IF YOU HAVE NOT HEARD FROM US/SPECIALIST IN TWO WEEKS, PLEASE GIVE [facility] CALL AT [phone].   THE PATIENT IS ENCOURAGED TO PRACTICE SOCIAL DISTANCING DUE TO THE COVID-19 PANDEMIC.

## 2021-02-12 ENCOUNTER — Telehealth: Payer: Self-pay

## 2021-02-12 NOTE — Telephone Encounter (Signed)
-----   Message from Dorothyann Peng, MD sent at 02/12/2021  7:52 AM EDT ----- Your hba1c is 7.6, slightly up from last visit. Were you able to fill the prescription for Xigduo?   Your kidney function is stable.   RS

## 2021-02-12 NOTE — Telephone Encounter (Signed)
I left the pt a message to call the office back for lab results or log on to his mychart account.

## 2021-02-15 ENCOUNTER — Other Ambulatory Visit: Payer: Self-pay

## 2021-02-15 ENCOUNTER — Telehealth: Payer: Self-pay

## 2021-02-15 NOTE — Telephone Encounter (Signed)
The pt was notified that samples are up front for him to take 10 mg of Jardiance for 2 weeks, then increase to 25 mg. The pt will schedule his appt for 6wks f/u when he picks up his samples  The pt is currently driving.

## 2021-02-15 NOTE — Telephone Encounter (Signed)
The pt said that his insurance doesn't cover the xigduo that it's $420.  The pt was told that the office would do a prior auth and let him know if it's approved or not.

## 2021-04-12 ENCOUNTER — Other Ambulatory Visit: Payer: Self-pay | Admitting: Internal Medicine

## 2021-04-12 ENCOUNTER — Encounter: Payer: Self-pay | Admitting: Internal Medicine

## 2021-04-12 ENCOUNTER — Ambulatory Visit: Payer: 59 | Admitting: Internal Medicine

## 2021-04-12 ENCOUNTER — Other Ambulatory Visit: Payer: Self-pay

## 2021-04-12 VITALS — BP 118/66 | HR 81 | Temp 98.1°F | Ht 70.0 in | Wt 202.8 lb

## 2021-04-12 DIAGNOSIS — I119 Hypertensive heart disease without heart failure: Secondary | ICD-10-CM | POA: Diagnosis not present

## 2021-04-12 DIAGNOSIS — E663 Overweight: Secondary | ICD-10-CM | POA: Diagnosis not present

## 2021-04-12 DIAGNOSIS — E785 Hyperlipidemia, unspecified: Secondary | ICD-10-CM

## 2021-04-12 DIAGNOSIS — E1169 Type 2 diabetes mellitus with other specified complication: Secondary | ICD-10-CM | POA: Diagnosis not present

## 2021-04-12 DIAGNOSIS — Z6829 Body mass index (BMI) 29.0-29.9, adult: Secondary | ICD-10-CM

## 2021-04-12 DIAGNOSIS — Z23 Encounter for immunization: Secondary | ICD-10-CM | POA: Diagnosis not present

## 2021-04-12 MED ORDER — SHINGRIX 50 MCG/0.5ML IM SUSR: 0.5000 mL | Freq: Once | INTRAMUSCULAR | 0 refills | Status: AC

## 2021-04-12 MED ORDER — ATORVASTATIN CALCIUM 20 MG PO TABS: 20.0000 mg | ORAL_TABLET | Freq: Every day | ORAL | 2 refills | Status: DC

## 2021-04-12 MED ORDER — EMPAGLIFLOZIN 25 MG PO TABS: 25.0000 mg | ORAL_TABLET | Freq: Every day | ORAL | 5 refills | Status: DC

## 2021-04-12 NOTE — Patient Instructions (Signed)
Diabetes Mellitus and Exercise Exercising regularly is important for overall health, especially for people who have diabetes mellitus. Exercising is not only about losing weight. It has many other health benefits, such as increasing muscle strength and bone density and reducing body fat and stress. This leads to improved fitness, flexibility, and endurance, all of which result in better overall health. What are the benefits of exercise if I have diabetes? Exercise has many benefits for people with diabetes. They include:  Helping to lower and control blood sugar (glucose).  Helping the body to respond better to the hormone insulin by improving insulin sensitivity.  Reducing how much insulin the body needs.  Lowering the risk for heart disease by: ? Lowering "bad" cholesterol and triglyceride levels. ? Increasing "good" cholesterol levels. ? Lowering blood pressure. ? Lowering blood glucose levels. What is my activity plan? Your health care provider or certified diabetes educator can help you make a plan for the type and frequency of exercise that works for you. This is called your activity plan. Be sure to:  Get at least 150 minutes of medium-intensity or high-intensity exercise each week. Exercises may include brisk walking, biking, or water aerobics.  Do stretching and strengthening exercises, such as yoga or weight lifting, at least 2 times a week.  Spread out your activity over at least 3 days of the week.  Get some form of physical activity each day. ? Do not go more than 2 days in a row without some kind of physical activity. ? Avoid being inactive for more than 90 minutes at a time. Take frequent breaks to walk or stretch.  Choose exercises or activities that you enjoy. Set realistic goals.  Start slowly and gradually increase your exercise intensity over time.   How do I manage my diabetes during exercise? Monitor your blood glucose  Check your blood glucose before and  after exercising. If your blood glucose is: ? 240 mg/dL (13.3 mmol/L) or higher before you exercise, check your urine for ketones. These are chemicals created by the liver. If you have ketones in your urine, do not exercise until your blood glucose returns to normal. ? 100 mg/dL (5.6 mmol/L) or lower, eat a snack containing 15-20 grams of carbohydrate. Check your blood glucose 15 minutes after the snack to make sure that your glucose level is above 100 mg/dL (5.6 mmol/L) before you start your exercise.  Know the symptoms of low blood glucose (hypoglycemia) and how to treat it. Your risk for hypoglycemia increases during and after exercise. Follow these tips and your health care provider's instructions  Keep a carbohydrate snack that is fast-acting for use before, during, and after exercise to help prevent or treat hypoglycemia.  Avoid injecting insulin into areas of the body that are going to be exercised. For example, avoid injecting insulin into: ? Your arms, when you are about to play tennis. ? Your legs, when you are about to go jogging.  Keep records of your exercise habits. Doing this can help you and your health care provider adjust your diabetes management plan as needed. Write down: ? Food that you eat before and after you exercise. ? Blood glucose levels before and after you exercise. ? The type and amount of exercise you have done.  Work with your health care provider when you start a new exercise or activity. He or she may need to: ? Make sure that the activity is safe for you. ? Adjust your insulin, other medicines, and food that   you eat.  Drink plenty of water while you exercise. This prevents loss of water (dehydration) and problems caused by a lot of heat in the body (heat stroke).   Where to find more information  American Diabetes Association: www.diabetes.org Summary  Exercising regularly is important for overall health, especially for people who have diabetes  mellitus.  Exercising has many health benefits. It increases muscle strength and bone density and reduces body fat and stress. It also lowers and controls blood glucose.  Your health care provider or certified diabetes educator can help you make an activity plan for the type and frequency of exercise that works for you.  Work with your health care provider to make sure any new activity is safe for you. Also work with your health care provider to adjust your insulin, other medicines, and the food you eat. This information is not intended to replace advice given to you by your health care provider. Make sure you discuss any questions you have with your health care provider. Document Revised: 07/22/2019 Document Reviewed: 07/22/2019 Elsevier Patient Education  2021 Elsevier Inc.  

## 2021-04-12 NOTE — Progress Notes (Signed)
I,Katawbba Wiggins,acting as a Education administrator for Maximino Greenland, MD.,have documented all relevant documentation on the behalf of Maximino Greenland, MD,as directed by  Maximino Greenland, MD while in the presence of Maximino Greenland, MD.  This visit occurred during the SARS-CoV-2 public health emergency.  Safety protocols were in place, including screening questions prior to the visit, additional usage of staff PPE, and extensive cleaning of exam room while observing appropriate contact time as indicated for disinfecting solutions.  Subjective:     Patient ID: Darin Pitts , male    DOB: 1956/01/07 , 65 y.o.   MRN: [id number]   Chief Complaint  Patient presents with   Diabetes   Hypertension    HPI  He presents today for DM/HTN f/u.  He reports compliance with meds. He denies headaches, chest pain and shortness of breath.   Diabetes He presents for his follow-up diabetic visit. He has type 2 diabetes mellitus. His disease course has been improving. There are no hypoglycemic associated symptoms. There are no diabetic associated symptoms. Pertinent negatives for diabetes include no blurred vision. There are no hypoglycemic complications. Diabetic complications include heart disease. Risk factors for coronary artery disease include diabetes mellitus, dyslipidemia, hypertension and male sex. He is compliant with treatment most of the time. He is following a diabetic diet. He participates in exercise three times a week. Eye exam is current.  Hypertension This is a chronic problem. The current episode started more than 1 year ago. The problem has been gradually improving since onset. The problem is controlled. Pertinent negatives include no blurred vision or palpitations. Risk factors for coronary artery disease include diabetes mellitus, dyslipidemia, obesity and male gender. Past treatments include beta blockers. The current treatment provides moderate improvement. Hypertensive end-organ damage includes  CAD/MI.    Past Medical History:  Diagnosis Date   CAD (coronary artery disease) 2010   CABG x 1  DR. OWEN   Hyperglycemia      Family History  Problem Relation Age of Onset   Hypertension Father    Diabetes Father    Hypertension Mother    Stroke Mother    Diabetes Mother    Alzheimer's disease Mother    Hypertension Brother    Diabetes Brother      Current Outpatient Medications:    aspirin EC 81 MG tablet, Take 1 tablet (81 mg total) by mouth daily., Disp: , Rfl:    Lancets (UNILET COMFORTOUCH LANCET) MISC, Use as directed to check blood sugars 1 time per day dx: e11.65, Disp: 100 each, Rfl: 1   metoprolol tartrate (LOPRESSOR) 25 MG tablet, Take 1 tablet (25 mg total) by mouth 2 (two) times daily., Disp: 180 tablet, Rfl: 3   Multiple Vitamin (MULTIVITAMIN) tablet, Take 1 tablet by mouth daily., Disp: , Rfl:    ONETOUCH ULTRA test strip, CHECK BLOOD SUGAR EVERY DAY, Disp: 100 strip, Rfl: 3   atorvastatin (LIPITOR) 20 MG tablet, Take 1 tablet (20 mg total) by mouth daily at 6 PM., Disp: 90 tablet, Rfl: 2   empagliflozin (JARDIANCE) 25 MG TABS tablet, Take 1 tablet (25 mg total) by mouth daily., Disp: 30 tablet, Rfl: 5   No Known Allergies   Review of Systems  Constitutional: Negative.   Eyes:  Negative for blurred vision.  Respiratory: Negative.    Cardiovascular: Negative.  Negative for palpitations.  Gastrointestinal: Negative.   Psychiatric/Behavioral: Negative.    All other systems reviewed and are negative.   Today's Vitals  04/12/21 0932  BP: 118/66  Pulse: 81  Temp: 98.1 F (36.7 C)  TempSrc: Oral  Weight: 202 lb 12.8 oz (92 kg)  Height: 5' 10"  (1.778 m)   Body mass index is 29.1 kg/m.  Wt Readings from Last 3 Encounters:  04/12/21 202 lb 12.8 oz (92 kg)  02/08/21 211 lb 12.8 oz (96.1 kg)  11/10/20 217 lb 12.8 oz (98.8 kg)   BP Readings from Last 3 Encounters:  04/12/21 118/66  02/08/21 116/74  11/10/20 126/70   Objective:  Physical  Exam Vitals and nursing note reviewed.  Constitutional:      Appearance: Normal appearance.  HENT:     Nose:     Comments: Masked     Mouth/Throat:     Comments: Masked  Cardiovascular:     Rate and Rhythm: Normal rate and regular rhythm.     Heart sounds: Normal heart sounds.  Pulmonary:     Effort: Pulmonary effort is normal.     Breath sounds: Normal breath sounds.  Skin:    General: Skin is warm.  Neurological:     General: No focal deficit present.     Mental Status: He is alert.  Psychiatric:        Mood and Affect: Mood normal.        Assessment And Plan:     1. Type 2 diabetes mellitus with hyperlipidemia ([place]) Comments: Chronic, he agrees to [place] [facility] a lab visit. Last a1c April 2022, 7.6. I will see him again in Oct.  - Hemoglobin A1c; Future - CMP14+EGFR; Future  2. Benign hypertensive heart disease without heart failure Comments: Chronic, well controlled. He is encouraged to avoid adding salt to his foods.   3. Overweight with body mass index (BMI) of 29 to 29.9 in adult Comments: He has lost 9 pounds since April 2022. He is encouraged to maintain this weight loss.  4. Need for vaccination Comments: He was given pneumovax to update immunization history. I will also send rx Shingrix to her local pharmacy.   Patient was given opportunity to ask questions. Patient verbalized understanding of the plan and was able to repeat key elements of the plan. All questions were answered to their satisfaction.   I, Maximino Greenland, MD, have reviewed all documentation for this visit. The documentation on 04/12/21 for the exam, diagnosis, procedures, and orders are all accurate and complete.   IF YOU HAVE BEEN REFERRED TO A SPECIALIST, IT MAY TAKE 1-2 WEEKS TO SCHEDULE/PROCESS THE REFERRAL. IF YOU HAVE NOT HEARD FROM US/SPECIALIST IN TWO WEEKS, PLEASE GIVE [place] A CALL AT [phone].   THE PATIENT IS ENCOURAGED TO PRACTICE SOCIAL DISTANCING DUE  TO THE COVID-19 PANDEMIC.

## 2021-04-18 ENCOUNTER — Encounter: Payer: Self-pay | Admitting: Internal Medicine

## 2021-04-20 ENCOUNTER — Telehealth: Payer: Self-pay

## 2021-04-20 NOTE — Telephone Encounter (Signed)
Left pt message, told pt to give [place] a call back to notify him the the PA was completed.

## 2021-04-20 NOTE — Telephone Encounter (Signed)
Pts PA also approved.

## 2021-04-28 ENCOUNTER — Other Ambulatory Visit: Payer: Self-pay | Admitting: Internal Medicine

## 2021-05-12 ENCOUNTER — Telehealth: Payer: Self-pay | Admitting: Cardiovascular Disease

## 2021-05-12 DIAGNOSIS — Z024 Encounter for examination for driving license: Secondary | ICD-10-CM

## 2021-05-12 DIAGNOSIS — I25810 Atherosclerosis of coronary artery bypass graft(s) without angina pectoris: Secondary | ICD-10-CM

## 2021-05-12 NOTE — Telephone Encounter (Signed)
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

## 2021-05-12 NOTE — Telephone Encounter (Signed)
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 Advised patient that I will send a message to Dr. Elease Hashimoto for orders and then our schedulers will reach out to set it up.

## 2021-05-12 NOTE — Telephone Encounter (Signed)
Left message for patient to call back  

## 2021-05-17 ENCOUNTER — Other Ambulatory Visit: Payer: Self-pay

## 2021-05-17 ENCOUNTER — Ambulatory Visit: Payer: 59

## 2021-05-17 DIAGNOSIS — E1169 Type 2 diabetes mellitus with other specified complication: Secondary | ICD-10-CM

## 2021-05-18 LAB — CMP14+EGFR
ALT: 51 IU/L — ABNORMAL HIGH (ref 0–44)
AST: 27 IU/L (ref 0–40)
Albumin/Globulin Ratio: 2 (ref 1.2–2.2)
Albumin: 4.2 g/dL (ref 3.8–4.8)
Alkaline Phosphatase: 54 IU/L (ref 44–121)
BUN/Creatinine Ratio: 11 (ref 10–24)
BUN: 11 mg/dL (ref 8–27)
Bilirubin Total: 0.3 mg/dL (ref 0.0–1.2)
CO2: 22 mmol/L (ref 20–29)
Calcium: 9.3 mg/dL (ref 8.6–10.2)
Chloride: 104 mmol/L (ref 96–106)
Creatinine, Ser: 0.98 mg/dL (ref 0.76–1.27)
Globulin, Total: 2.1 g/dL (ref 1.5–4.5)
Glucose: 153 mg/dL — ABNORMAL HIGH (ref 65–99)
Potassium: 4.5 mmol/L (ref 3.5–5.2)
Sodium: 139 mmol/L (ref 134–144)
Total Protein: 6.3 g/dL (ref 6.0–8.5)
eGFR: 86 mL/min/{1.73_m2} (ref >59)

## 2021-05-18 LAB — HEMOGLOBIN A1C
Est. average glucose Bld gHb Est-mCnc: 166 mg/dL
Hgb A1c MFr Bld: 7.4 % — ABNORMAL HIGH (ref 4.8–5.6)

## 2021-07-05 ENCOUNTER — Ambulatory Visit: Payer: 59 | Admitting: Internal Medicine

## 2021-07-19 LAB — HM DIABETES EYE EXAM

## 2021-08-16 ENCOUNTER — Other Ambulatory Visit: Payer: Self-pay

## 2021-08-16 ENCOUNTER — Encounter: Payer: Self-pay | Admitting: Internal Medicine

## 2021-08-16 ENCOUNTER — Ambulatory Visit: Payer: 59 | Admitting: Internal Medicine

## 2021-08-16 VITALS — BP 130/70 | HR 78 | Temp 97.7°F | Ht 70.0 in | Wt 200.4 lb

## 2021-08-16 DIAGNOSIS — I119 Hypertensive heart disease without heart failure: Secondary | ICD-10-CM | POA: Diagnosis not present

## 2021-08-16 DIAGNOSIS — E1169 Type 2 diabetes mellitus with other specified complication: Secondary | ICD-10-CM

## 2021-08-16 DIAGNOSIS — I25810 Atherosclerosis of coronary artery bypass graft(s) without angina pectoris: Secondary | ICD-10-CM | POA: Diagnosis not present

## 2021-08-16 DIAGNOSIS — Z23 Encounter for immunization: Secondary | ICD-10-CM | POA: Diagnosis not present

## 2021-08-16 DIAGNOSIS — E785 Hyperlipidemia, unspecified: Secondary | ICD-10-CM

## 2021-08-16 DIAGNOSIS — E663 Overweight: Secondary | ICD-10-CM | POA: Diagnosis not present

## 2021-08-16 DIAGNOSIS — Z6828 Body mass index (BMI) 28.0-28.9, adult: Secondary | ICD-10-CM

## 2021-08-16 LAB — BMP8+EGFR
BUN/Creatinine Ratio: 11 (ref 10–24)
BUN: 13 mg/dL (ref 8–27)
CO2: 20 mmol/L (ref 20–29)
Calcium: 10.3 mg/dL — ABNORMAL HIGH (ref 8.6–10.2)
Chloride: 104 mmol/L (ref 96–106)
Creatinine, Ser: 1.18 mg/dL (ref 0.76–1.27)
Glucose: 161 mg/dL — ABNORMAL HIGH (ref 70–99)
Potassium: 5.3 mmol/L — ABNORMAL HIGH (ref 3.5–5.2)
Sodium: 143 mmol/L (ref 134–144)
eGFR: 68 mL/min/{1.73_m2} (ref >59)

## 2021-08-16 LAB — HEMOGLOBIN A1C
Est. average glucose Bld gHb Est-mCnc: 192 mg/dL
Hgb A1c MFr Bld: 8.3 % — ABNORMAL HIGH (ref 4.8–5.6)

## 2021-08-16 MED ORDER — ZOSTER VAC RECOMB ADJUVANTED 50 MCG/0.5ML IM SUSR: 0.5000 mL | Freq: Once | INTRAMUSCULAR | 1 refills | Status: AC

## 2021-08-16 NOTE — Progress Notes (Signed)
I,Tianna Badgett,acting as a Education administrator for Maximino Greenland, MD.,have documented all relevant documentation on the behalf of Maximino Greenland, MD,as directed by  Maximino Greenland, MD while in the presence of Maximino Greenland, MD.  This visit occurred during the SARS-CoV-2 public health emergency.  Safety protocols were in place, including screening questions prior to the visit, additional usage of staff PPE, and extensive cleaning of exam room while observing appropriate contact time as indicated for disinfecting solutions.  Subjective:     Patient ID: Darin Pitts , male    DOB: 03/17/1956 , 65 y.o.   MRN: [id number]   Chief Complaint  Patient presents with   Diabetes   Hypertension    HPI  He presents today for DM/HTN f/u.  He reports compliance with meds. He denies headaches, chest pain and shortness of breath.   Diabetes He presents for his follow-up diabetic visit. He has type 2 diabetes mellitus. His disease course has been improving. There are no hypoglycemic associated symptoms. There are no diabetic associated symptoms. Pertinent negatives for diabetes include no blurred vision. There are no hypoglycemic complications. Diabetic complications include heart disease. Risk factors for coronary artery disease include diabetes mellitus, dyslipidemia, hypertension and male sex. He is compliant with treatment most of the time. He is following a diabetic diet. He participates in exercise three times a week. Eye exam is current.  Hypertension This is a chronic problem. The current episode started more than 1 year ago. The problem has been gradually improving since onset. The problem is controlled. Pertinent negatives include no blurred vision or palpitations. Risk factors for coronary artery disease include diabetes mellitus, dyslipidemia, obesity and male gender. Past treatments include beta blockers. The current treatment provides moderate improvement. Hypertensive end-organ damage includes  CAD/MI.    Past Medical History:  Diagnosis Date   CAD (coronary artery disease) 2010   CABG x 1  DR. OWEN   Hyperglycemia      Family History  Problem Relation Age of Onset   Hypertension Father    Diabetes Father    Hypertension Mother    Stroke Mother    Diabetes Mother    Alzheimer's disease Mother    Hypertension Brother    Diabetes Brother      Current Outpatient Medications:    aspirin EC 81 MG tablet, Take 1 tablet (81 mg total) by mouth daily., Disp: , Rfl:    atorvastatin (LIPITOR) 20 MG tablet, Take 1 tablet (20 mg total) by mouth daily at 6 PM., Disp: 90 tablet, Rfl: 2   JARDIANCE 25 MG TABS tablet, TAKE 1 TABLET (25 MG TOTAL) BY MOUTH DAILY., Disp: 30 tablet, Rfl: 5   metoprolol tartrate (LOPRESSOR) 25 MG tablet, Take 1 tablet (25 mg total) by mouth 2 (two) times daily., Disp: 180 tablet, Rfl: 3   Multiple Vitamin (MULTIVITAMIN) tablet, Take 1 tablet by mouth daily., Disp: , Rfl:    ONETOUCH ULTRA test strip, CHECK BLOOD SUGAR DAILY, Disp: 100 strip, Rfl: 3   TRUEplus Lancets 30G MISC, USE AS DIRECTED TO CHECK  BLOOD SUGAR ONCE DAILY, Disp: 100 each, Rfl: 3   Zoster Vaccine Adjuvanted (SHINGRIX) injection, Inject 0.5 mLs into the muscle once for 1 dose. Administer 2nd dose in 2-6 months Please fax when each dose administered [phone], Disp: 0.5 mL, Rfl: 1   No Known Allergies   Review of Systems  Constitutional: Negative.   Eyes:  Negative for blurred vision.  Respiratory: Negative.  Cardiovascular: Negative.  Negative for palpitations.  Gastrointestinal: Negative.   Neurological: Negative.     Today's Vitals   08/16/21 1011  BP: 130/70  Pulse: 78  Temp: 97.7 F (36.5 C)  TempSrc: Oral  Weight: 200 lb 6.4 oz (90.9 kg)  Height: _0  (1.778 m)   Body mass index is 28.75 kg/m.  Wt Readings from Last 3 Encounters:  08/16/21 200 lb 6.4 oz (90.9 kg)  04/12/21 202 lb 12.8 oz (92 kg)  02/08/21 211 lb 12.8 oz (96.1 kg)    Objective:  Physical  Exam Vitals and nursing note reviewed.  Constitutional:      Appearance: Normal appearance.  HENT:     Head: Normocephalic and atraumatic.     Nose:     Comments: Masked     Mouth/Throat:     Comments: Masked  Eyes:     Extraocular Movements: Extraocular movements intact.  Cardiovascular:     Rate and Rhythm: Normal rate and regular rhythm.     Heart sounds: Normal heart sounds.  Pulmonary:     Effort: Pulmonary effort is normal.     Breath sounds: Normal breath sounds.  Musculoskeletal:     Cervical back: Normal range of motion.  Skin:    General: Skin is warm.  Neurological:     General: No focal deficit present.     Mental Status: He is alert.  Psychiatric:        Mood and Affect: Mood normal.        Assessment And Plan:     1. Type 2 diabetes mellitus with hyperlipidemia ([facility]) Comments: Chronic, no issues with Jardiance 4m. We discussed switching to Synjardy if a1c remains above 7.0. Will discuss further when labs are available. - Hemoglobin A1c  2. Benign hypertensive heart disease without heart failure Comments: Chronic, controlled. No change in meds. Encouraged to follow low sodium diet.  - BMP8+EGFR  3. Coronary artery disease involving coronary bypass graft of native heart without angina pectoris Comments: Chronic, yest stable. Encouraged to follow heart healthy lifestyle.   4. Need for influenza vaccination - Flu Vaccine QUAD High Dose(Fluad)  5. Overweight with body mass index (BMI) of 28 to 28.9 in adult Comments: He is encouraged to aim for at least 150 minutes of exercise per week.   Patient was given opportunity to ask questions. Patient verbalized understanding of the plan and was able to repeat key elements of the plan. All questions were answered to their satisfaction.   I, RMaximino Greenland MD, have reviewed all documentation for this visit. The documentation on 08/16/21 for the exam, diagnosis, procedures, and orders are all accurate and  complete.   IF YOU HAVE BEEN REFERRED TO A SPECIALIST, IT MAY TAKE 1-2 WEEKS TO SCHEDULE/PROCESS THE REFERRAL. IF YOU HAVE NOT HEARD FROM US/SPECIALIST IN TWO WEEKS, PLEASE GIVE [place] CALL AT [phone].   THE PATIENT IS ENCOURAGED TO PRACTICE SOCIAL DISTANCING DUE TO THE COVID-19 PANDEMIC.

## 2021-08-16 NOTE — Patient Instructions (Signed)
Diabetes Mellitus and Nutrition, Adult When you have diabetes, or diabetes mellitus, it is very important to have healthy eating habits because your blood sugar (glucose) levels are greatly affected by what you eat and drink. Eating healthy foods in the right amounts, at about the same times every day, can help you:  Control your blood glucose.  Lower your risk of heart disease.  Improve your blood pressure.  Reach or maintain a healthy weight. What can affect my meal plan? Every person with diabetes is different, and each person has different needs for a meal plan. Your health care provider may recommend that you work with a dietitian to make a meal plan that is best for you. Your meal plan may vary depending on factors such as:  The calories you need.  The medicines you take.  Your weight.  Your blood glucose, blood pressure, and cholesterol levels.  Your activity level.  Other health conditions you have, such as heart or kidney disease. How do carbohydrates affect me? Carbohydrates, also called carbs, affect your blood glucose level more than any other type of food. Eating carbs naturally raises the amount of glucose in your blood. Carb counting is a method for keeping track of how many carbs you eat. Counting carbs is important to keep your blood glucose at a healthy level, especially if you use insulin or take certain oral diabetes medicines. It is important to know how many carbs you can safely have in each meal. This is different for every person. Your dietitian can help you calculate how many carbs you should have at each meal and for each snack. How does alcohol affect me? Alcohol can cause a sudden decrease in blood glucose (hypoglycemia), especially if you use insulin or take certain oral diabetes medicines. Hypoglycemia can be a life-threatening condition. Symptoms of hypoglycemia, such as sleepiness, dizziness, and confusion, are similar to symptoms of having too much  alcohol.  Do not drink alcohol if: ? Your health care provider tells you not to drink. ? You are pregnant, may be pregnant, or are planning to become pregnant.  If you drink alcohol: ? Do not drink on an empty stomach. ? Limit how much you use to:  0-1 drink a day for women.  0-2 drinks a day for men. ? Be aware of how much alcohol is in your drink. In the [place] one drink equals one 12 oz bottle of beer (355 mL), one 5 oz glass of wine (148 mL), or one 1 oz glass of hard liquor (44 mL). ? Keep yourself hydrated with water, diet soda, or unsweetened iced tea.  Keep in mind that regular soda, juice, and other mixers may contain a lot of sugar and must be counted as carbs. What are tips for following this plan? Reading food labels  Start by checking the serving size on the "Nutrition Facts" label of packaged foods and drinks. The amount of calories, carbs, fats, and other nutrients listed on the label is based on one serving of the item. Many items contain more than one serving per package.  Check the total grams (g) of carbs in one serving. You can calculate the number of servings of carbs in one serving by dividing the total carbs by 15. For example, if a food has 30 g of total carbs per serving, it would be equal to 2 servings of carbs.  Check the number of grams (g) of saturated fats and trans fats in one serving. Choose foods that have   a low amount or none of these fats.  Check the number of milligrams (mg) of salt (sodium) in one serving. Most people should limit total sodium intake to less than 2,300 mg per day.  Always check the nutrition information of foods labeled as "low-fat" or "nonfat." These foods may be higher in added sugar or refined carbs and should be avoided.  Talk to your dietitian to identify your daily goals for nutrients listed on the label. Shopping  Avoid buying canned, pre-made, or processed foods. These foods tend to be high in fat, sodium, and added  sugar.  Shop around the outside edge of the grocery store. This is where you will most often find fresh fruits and vegetables, bulk grains, fresh meats, and fresh dairy. Cooking  Use low-heat cooking methods, such as baking, instead of high-heat cooking methods like deep frying.  Cook using healthy oils, such as olive, canola, or sunflower oil.  Avoid cooking with butter, cream, or high-fat meats. Meal planning  Eat meals and snacks regularly, preferably at the same times every day. Avoid going long periods of time without eating.  Eat foods that are high in fiber, such as fresh fruits, vegetables, beans, and whole grains. Talk with your dietitian about how many servings of carbs you can eat at each meal.  Eat 4-6 oz (112-168 g) of lean protein each day, such as lean meat, chicken, fish, eggs, or tofu. One ounce (oz) of lean protein is equal to: ? 1 oz (28 g) of meat, chicken, or fish. ? 1 egg. ?  cup (62 g) of tofu.  Eat some foods each day that contain healthy fats, such as avocado, nuts, seeds, and fish.   What foods should I eat? Fruits Berries. Apples. Oranges. Peaches. Apricots. Plums. Grapes. Mango. Papaya. Pomegranate. Kiwi. Cherries. Vegetables Lettuce. Spinach. Leafy greens, including kale, chard, collard greens, and mustard greens. Beets. Cauliflower. Cabbage. Broccoli. Carrots. Green beans. Tomatoes. Peppers. Onions. Cucumbers. Brussels sprouts. Grains Whole grains, such as whole-wheat or whole-grain bread, crackers, tortillas, cereal, and pasta. Unsweetened oatmeal. Quinoa. Brown or wild rice. Meats and other proteins Seafood. Poultry without skin. Lean cuts of poultry and beef. Tofu. Nuts. Seeds. Dairy Low-fat or fat-free dairy products such as milk, yogurt, and cheese. The items listed above may not be a complete list of foods and beverages you can eat. Contact a dietitian for more information. What foods should I avoid? Fruits Fruits canned with  syrup. Vegetables Canned vegetables. Frozen vegetables with butter or cream sauce. Grains Refined white flour and flour products such as bread, pasta, snack foods, and cereals. Avoid all processed foods. Meats and other proteins Fatty cuts of meat. Poultry with skin. Breaded or fried meats. Processed meat. Avoid saturated fats. Dairy Full-fat yogurt, cheese, or milk. Beverages Sweetened drinks, such as soda or iced tea. The items listed above may not be a complete list of foods and beverages you should avoid. Contact a dietitian for more information. Questions to ask a health care provider  Do I need to meet with a diabetes educator?  Do I need to meet with a dietitian?  What number can I call if I have questions?  When are the best times to check my blood glucose? Where to find more information:  American Diabetes Association: diabetes.org  Academy of Nutrition and Dietetics: www.eatright.org  National Institute of Diabetes and Digestive and Kidney Diseases: www.niddk.nih.gov  Association of Diabetes Care and Education Specialists: www.diabeteseducator.org Summary  It is important to have healthy eating   habits because your blood sugar (glucose) levels are greatly affected by what you eat and drink.  A healthy meal plan will help you control your blood glucose and maintain a healthy lifestyle.  Your health care provider may recommend that you work with a dietitian to make a meal plan that is best for you.  Keep in mind that carbohydrates (carbs) and alcohol have immediate effects on your blood glucose levels. It is important to count carbs and to use alcohol carefully. This information is not intended to replace advice given to you by your health care provider. Make sure you discuss any questions you have with your health care provider. Document Revised: 10/01/2019 Document Reviewed: 10/01/2019 Elsevier Patient Education  2021 Elsevier Inc.  

## 2021-08-17 ENCOUNTER — Encounter: Payer: Self-pay | Admitting: Internal Medicine

## 2021-08-19 ENCOUNTER — Other Ambulatory Visit: Payer: Self-pay

## 2021-08-19 MED ORDER — SYNJARDY XR 25-1000 MG PO TB24: 25.0000 mg | ORAL_TABLET | Freq: Every day | ORAL | 1 refills | Status: DC

## 2021-09-02 ENCOUNTER — Other Ambulatory Visit: Payer: Self-pay

## 2021-09-02 ENCOUNTER — Ambulatory Visit (INDEPENDENT_AMBULATORY_CARE_PROVIDER_SITE_OTHER): Payer: 59

## 2021-09-02 DIAGNOSIS — Z024 Encounter for examination for driving license: Secondary | ICD-10-CM | POA: Diagnosis not present

## 2021-09-02 DIAGNOSIS — I25810 Atherosclerosis of coronary artery bypass graft(s) without angina pectoris: Secondary | ICD-10-CM | POA: Diagnosis not present

## 2021-09-02 LAB — EXERCISE TOLERANCE TEST
Angina Index: 0
Duke Treadmill Score: 9
Estimated workload: 10.1
Exercise duration (min): 9 min
Exercise duration (sec): 0 s
MPHR: 155 {beats}/min
Peak HR: 141 {beats}/min
Percent HR: 90 %
RPE: 16
Rest HR: 83 {beats}/min
ST Depression (mm): 0 mm

## 2021-09-03 ENCOUNTER — Telehealth: Payer: Self-pay | Admitting: Cardiovascular Disease

## 2021-09-03 NOTE — Telephone Encounter (Signed)
Clearance letter faxed to number provided and placed in Medical Records fax box.

## 2021-09-03 NOTE — Telephone Encounter (Signed)
MD is aware of paperwork will f/u.

## 2021-09-03 NOTE — Telephone Encounter (Signed)
Pt would like to make sure the DOT paperwork in in process. Pt states he gave it to the nurse on 09/02/21

## 2021-09-06 NOTE — Telephone Encounter (Signed)
[facility] and they said that they received the fax on Friday but the last page with the stress test was cut off. I have refaxed everything and let them know to let us know if they need anything further. Confirmation received that fax was successful.  Left detailed message on patient's VM (DPR on file) letting him know.

## 2021-09-06 NOTE — Telephone Encounter (Signed)
Pt is following up on DOT [facility], Sharol Harness PA was unable to read the stress test and she would like it faxed to her [phone]

## 2021-09-13 ENCOUNTER — Encounter: Payer: Self-pay | Admitting: Cardiovascular Disease

## 2021-09-13 ENCOUNTER — Ambulatory Visit: Payer: 59 | Admitting: Cardiovascular Disease

## 2021-09-13 ENCOUNTER — Other Ambulatory Visit: Payer: Self-pay

## 2021-09-13 VITALS — BP 128/68 | HR 78 | Ht 70.0 in | Wt 203.0 lb

## 2021-09-13 DIAGNOSIS — I25810 Atherosclerosis of coronary artery bypass graft(s) without angina pectoris: Secondary | ICD-10-CM

## 2021-09-13 DIAGNOSIS — E782 Mixed hyperlipidemia: Secondary | ICD-10-CM

## 2021-09-13 NOTE — Progress Notes (Signed)
Darin Pitts Date of Birth  1955-11-13       [facility] Office [address], [address] [place], [place]  [address] [phone]   Fax  [phone]    Problem List: 1. Coronary artery disease-status post CABG by Dr. Dunklee Moras in November, 2010 2. Diabetes Mellitus 3. Hyperlipidemia    Darin Pitts is a 65 y.o. gentleman with a history of coronary artery disease. He has not had any difficulty since his bypass grafting in 2010. 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  He tries to eat a low fat diet.  Nov. 3, 2014:  Darin Pitts has been seen by Darin Pitts since I last saw him.   No complaints.  He still works as a Naval architect for [employer].    He is exercising regularly.   Nov. 9, 2015:  Darin Pitts is doing well.  No angina.  Working out regularly. No issues. Doing well  is fasting for lab work  Nov. 14, 2016:   Doing great . Works out regularly , no CP    Nov. 20, 2017:  Darin Pitts is seen for follow up visit . Working out regularly. No CP or dyspnea.   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   October 18, 2017:  Doing well .   Working out regularly .   October 22, 2018: Darin Pitts  is seen today for a follow-up visit. History of coronary artery disease and is status post coronary artery bypass grafting in 2010.  He has a history of hyperlipidemia. Works out regularly  Recent labs look great   October 08, 2019:  Darin Pitts is seen today for follow-up visit.  He has a history of CAD and status post CABG in 2010.  He has a history of hyperlipidemia. No cp or dyspnea  Works out regularly .    Drives for [employer]  He brought a note from Dr. Kathrynn Running requesting exercise treadmill test and an updated ejection fraction which is apparently needed every 5 years.  He also is requesting a letter of cardiac clearance for Darin Pitts.  Oct. 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  Still working out No angina , no limitations with working out  Nov. 7, 2022: Darin Pitts is seen for follow up of his coronary artery disease, CABG, hyperlipidemia. We recently did a CDL treadmill stress test during which he walked for 9 minutes.  He achieved a peak heart rate of 141 which is 90% predicted maximal heart rate.  He achieved 10.1 METS.  There were no ST or T wave changes.  The stress test was negative. No CP , no dyspnea . , BP is well controlled.   Current Outpatient Medications on File Prior to Visit  Medication Sig Dispense Refill   aspirin EC 81 MG tablet Take 1 tablet (81 mg total) by mouth daily.     atorvastatin (LIPITOR) 20 MG tablet Take 1 tablet (20 mg total) by mouth daily at 6 PM. 90 tablet 2   Empagliflozin-metFORMIN HCl ER (SYNJARDY XR) 25-1000 MG TB24 Take 25-1,000 mg by mouth daily at 6 (six) AM. 90 tablet 1   metoprolol  tartrate (LOPRESSOR) 25 MG tablet Take 1 tablet (25 mg total) by mouth 2 (two) times daily. 180 tablet 3   Multiple Vitamin (MULTIVITAMIN) tablet Take 1 tablet by mouth daily.     ONETOUCH ULTRA test strip CHECK BLOOD SUGAR DAILY 100 strip 3   TRUEplus Lancets 30G MISC USE AS DIRECTED TO CHECK  BLOOD SUGAR ONCE DAILY 100 each 3   JARDIANCE 25 MG TABS tablet TAKE 1 TABLET (25 MG TOTAL) BY MOUTH DAILY. (Patient not taking: Reported on 09/13/2021) 30 tablet 5   No current facility-administered medications on file prior to visit.    No Known Allergies  Past Medical History:  Diagnosis Date   CAD (coronary artery disease) 2010   CABG x 1  DR. OWEN   Hyperglycemia     Past Surgical History:  Procedure Laterality Date   CORONARY ARTERY BYPASS GRAFT  2010   HEMORRHOID SURGERY  2002    Social History   Tobacco Use  Smoking Status Never  Smokeless Tobacco Never    Social  History   Substance and Sexual Activity  Alcohol Use No    Family History  Problem Relation Age of Onset   Hypertension Father    Diabetes Father    Hypertension Mother    Stroke Mother    Diabetes Mother    Alzheimer's disease Mother    Hypertension Brother    Diabetes Brother     Reviw of Systems:  Reviewed in the HPI.  All other systems are negative.   Physical Exam: Blood pressure 128/68, pulse 78, height 5\' 10"  (1.778 m), weight 203 lb (92.1 kg), SpO2 98 %.  GEN:  Well nourished, well developed in no acute distress HEENT: Normal NECK: No JVD; No carotid bruits LYMPHATICS: No lymphadenopathy CARDIAC: RRR , no murmurs, rubs, gallops RESPIRATORY:  Clear to auscultation without rales, wheezing or rhonchi  ABDOMEN: Soft, non-tender, non-distended MUSCULOSKELETAL:  No edema; No deformity  SKIN: Warm and dry NEUROLOGIC:  Alert and oriented x 3    ECG:      Assessment / Plan:    1. Coronary artery disease-   Lewis is doing well.  Is not having any episodes of chest pain or shortness of breath. He had a negative stress test.  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  I will see him again in 1 year.  Anticipate he will need another exercise stress test at that time. .    2. Diabetes Mellitus -     3. Hyperlipidemia-   check lipids today.  His basic metabolic profile and liver enzymes were normal last month.    , MD  09/13/2021 2:25 PM    [facility] [address] [place],  [address] [place], [place]  [place] Pager [phone] Phone: [phone]; Fax: [phone]

## 2021-09-13 NOTE — Patient Instructions (Signed)
Medication Instructions:  Your physician recommends that you continue on your current medications as directed. Please refer to the Current Medication list given to you today.  *If you need a refill on your cardiac medications before your next appointment, please call your pharmacy*   Lab Work: TODAY: Lipid panel If you have labs (blood work) drawn today and your tests are completely normal, you will receive your results only by: MyChart Message (if you have MyChart) OR A paper copy in the mail If you have any lab test that is abnormal or we need to change your treatment, we will call you to review the results.   Testing/Procedures: NONE   Follow-Up: At [facility], you and your health needs are our priority.  As part of our continuing mission to provide you with exceptional heart care, we have created designated Provider Care Teams.  These Care Teams include your primary Cardiologist (physician) and Advanced Practice Providers (APPs -  Physician Assistants and Nurse Practitioners) who all work together to provide you with the care you need, when you need it.  We recommend signing up for the patient portal called "MyChart".  Sign up information is provided on this After Visit Summary.  MyChart is used to connect with patients for Virtual Visits (Telemedicine).  Patients are able to view lab/test results, encounter notes, upcoming appointments, etc.  Non-urgent messages can be sent to your provider as well.   To learn more about what you can do with MyChart, go to [email or web address].    Your next appointment:   1 year(s)  The format for your next appointment:   In Person  Provider:   Kristeen Miss, MD or APP :1}

## 2021-09-14 LAB — LIPID PANEL
Chol/HDL Ratio: 2.9 ratio (ref 0.0–5.0)
Cholesterol, Total: 126 mg/dL (ref 100–199)
HDL: 44 mg/dL (ref >39)
LDL Chol Calc (NIH): 58 mg/dL (ref 0–99)
Triglycerides: 141 mg/dL (ref 0–149)
VLDL Cholesterol Cal: 24 mg/dL (ref 5–40)

## 2021-09-20 ENCOUNTER — Ambulatory Visit: Payer: 59 | Admitting: Internal Medicine

## 2021-09-20 ENCOUNTER — Encounter: Payer: Self-pay | Admitting: Internal Medicine

## 2021-09-20 ENCOUNTER — Other Ambulatory Visit: Payer: Self-pay

## 2021-09-20 VITALS — BP 116/80 | HR 73 | Temp 98.0°F | Ht 70.0 in | Wt 203.0 lb

## 2021-09-20 DIAGNOSIS — E1169 Type 2 diabetes mellitus with other specified complication: Secondary | ICD-10-CM

## 2021-09-20 DIAGNOSIS — Z79899 Other long term (current) drug therapy: Secondary | ICD-10-CM | POA: Diagnosis not present

## 2021-09-20 DIAGNOSIS — Z6829 Body mass index (BMI) 29.0-29.9, adult: Secondary | ICD-10-CM

## 2021-09-20 DIAGNOSIS — E785 Hyperlipidemia, unspecified: Secondary | ICD-10-CM | POA: Diagnosis not present

## 2021-09-20 DIAGNOSIS — E663 Overweight: Secondary | ICD-10-CM | POA: Diagnosis not present

## 2021-09-20 LAB — BMP8+EGFR
BUN/Creatinine Ratio: 12 (ref 10–24)
BUN: 12 mg/dL (ref 8–27)
CO2: 21 mmol/L (ref 20–29)
Calcium: 9.7 mg/dL (ref 8.6–10.2)
Chloride: 104 mmol/L (ref 96–106)
Creatinine, Ser: 1.02 mg/dL (ref 0.76–1.27)
Glucose: 116 mg/dL — ABNORMAL HIGH (ref 70–99)
Potassium: 4.7 mmol/L (ref 3.5–5.2)
Sodium: 140 mmol/L (ref 134–144)
eGFR: 82 mL/min/{1.73_m2} (ref >59)

## 2021-09-20 NOTE — Progress Notes (Signed)
I,Katawbba Wiggins,acting as a Education administrator for Maximino Greenland, MD.,have documented all relevant documentation on the behalf of Maximino Greenland, MD,as directed by  Maximino Greenland, MD while in the presence of Maximino Greenland, MD.  This visit occurred during the SARS-CoV-2 public health emergency.  Safety protocols were in place, including screening questions prior to the visit, additional usage of staff PPE, and extensive cleaning of exam room while observing appropriate contact time as indicated for disinfecting solutions.  Subjective:     Patient ID: Darin Pitts , male    DOB: [birth year] , 65 y.o.   MRN: [id number]   Chief Complaint  Patient presents with   Diabetes    HPI  He presents today for DM f/u.  He was switched from [place] to [place] at his last visit. He was given samples of 25/1000mg dosage; however, states he never used them. He instead filled a rx at the pharmacy, states the pill is blue. The 25/1000mg dose of Synjardy is tan. He is unable to state the name of the medication that he is taking.   Diabetes He presents for his follow-up diabetic visit. He has type 2 diabetes mellitus. His disease course has been improving. There are no hypoglycemic associated symptoms. There are no diabetic associated symptoms. There are no hypoglycemic complications. Diabetic complications include heart disease. Risk factors for coronary artery disease include diabetes mellitus, dyslipidemia, hypertension and male sex. He is compliant with treatment most of the time. He is following a diabetic diet. He participates in exercise three times a week. Eye exam is current.    Past Medical History:  Diagnosis Date   CAD (coronary artery disease) 2010   CABG x 1  DR. OWEN   Hyperglycemia      Family History  Problem Relation Age of Onset   Hypertension Father    Diabetes Father    Hypertension Mother    Stroke Mother    Diabetes Mother    Alzheimer's disease Mother    Hypertension  Brother    Diabetes Brother      Current Outpatient Medications:    aspirin EC 81 MG tablet, Take 1 tablet (81 mg total) by mouth daily., Disp: , Rfl:    atorvastatin (LIPITOR) 20 MG tablet, Take 1 tablet (20 mg total) by mouth daily at 6 PM., Disp: 90 tablet, Rfl: 2   Empagliflozin-metFORMIN HCl ER (SYNJARDY XR) 25-1000 MG TB24, Take 25-1,000 mg by mouth daily at 6 (six) AM., Disp: 90 tablet, Rfl: 1   metoprolol tartrate (LOPRESSOR) 25 MG tablet, Take 1 tablet (25 mg total) by mouth 2 (two) times daily., Disp: 180 tablet, Rfl: 3   Multiple Vitamin (MULTIVITAMIN) tablet, Take 1 tablet by mouth daily., Disp: , Rfl:    ONETOUCH ULTRA test strip, CHECK BLOOD SUGAR DAILY, Disp: 100 strip, Rfl: 3   TRUEplus Lancets 30G MISC, USE AS DIRECTED TO CHECK  BLOOD SUGAR ONCE DAILY, Disp: 100 each, Rfl: 3   No Known Allergies   Review of Systems  Constitutional: Negative.   Respiratory: Negative.    Cardiovascular: Negative.   Gastrointestinal: Negative.   Psychiatric/Behavioral: Negative.    All other systems reviewed and are negative.   Today's Vitals   09/20/21 1005  BP: 116/80  Pulse: 73  Temp: 98 F (36.7 C)  Weight: 203 lb (92.1 kg)  Height: 5' 10"  (1.778 m)  PainSc: 0-No pain   Body mass index is 29.13 kg/m.  Wt Readings from Last 3 Encounters:  09/20/21 203 lb (92.1 kg)  09/13/21 203 lb (92.1 kg)  08/16/21 200 lb 6.4 oz (90.9 kg)    BP Readings from Last 3 Encounters:  09/20/21 116/80  09/13/21 128/68  08/16/21 130/70    Objective:  Physical Exam Vitals and nursing note reviewed.  Constitutional:      Appearance: Normal appearance.  HENT:     Head: Normocephalic and atraumatic.     Nose:     Comments: Masked     Mouth/Throat:     Comments: Masked  Cardiovascular:     Rate and Rhythm: Normal rate and regular rhythm.     Heart sounds: Normal heart sounds.  Pulmonary:     Effort: Pulmonary effort is normal.     Breath sounds: Normal breath sounds.   Musculoskeletal:     Cervical back: Normal range of motion.  Skin:    General: Skin is warm.  Neurological:     General: No focal deficit present.     Mental Status: He is alert.  Psychiatric:        Mood and Affect: Mood normal.        Assessment And Plan:     1. Type 2 diabetes mellitus with hyperlipidemia (HCC) Comments: Pt went home to grab meds, pharmacy filled Synjardy 12.5/1000mg DESPITE rx stating 25/1000mg qd. He will complete this and then start 25/1000mg dose. I will check renal function today since metformin was added to his regimen. All questions were answered to his satisfaction. He will rto in 2 months for his next a1c check.  - BMP8+EGFR  2. Overweight with body mass index (BMI) of 29 to 29.9 in adult Comments: He is advised to exercise no less than 150 minutes per week.    3. Drug therapy   Patient was given opportunity to ask questions. Patient verbalized understanding of the plan and was able to repeat key elements of the plan. All questions were answered to their satisfaction.   I, Maximino Greenland, MD, have reviewed all documentation for this visit. The documentation on 09/20/21 for the exam, diagnosis, procedures, and orders are all accurate and complete.   IF YOU HAVE BEEN REFERRED TO A SPECIALIST, IT MAY TAKE 1-2 WEEKS TO SCHEDULE/PROCESS THE REFERRAL. IF YOU HAVE NOT HEARD FROM US/SPECIALIST IN TWO WEEKS, PLEASE GIVE [place] A CALL AT [phone] X [phone].   THE PATIENT IS ENCOURAGED TO PRACTICE SOCIAL DISTANCING DUE TO THE COVID-19 PANDEMIC.

## 2021-09-20 NOTE — Patient Instructions (Signed)

## 2021-11-15 ENCOUNTER — Encounter: Payer: Self-pay | Admitting: Internal Medicine

## 2021-11-15 ENCOUNTER — Other Ambulatory Visit: Payer: Self-pay

## 2021-11-15 ENCOUNTER — Ambulatory Visit (INDEPENDENT_AMBULATORY_CARE_PROVIDER_SITE_OTHER): Payer: 59 | Admitting: Internal Medicine

## 2021-11-15 VITALS — BP 124/66 | HR 87 | Temp 98.6°F | Ht 70.0 in | Wt 203.2 lb

## 2021-11-15 DIAGNOSIS — E785 Hyperlipidemia, unspecified: Secondary | ICD-10-CM

## 2021-11-15 DIAGNOSIS — I119 Hypertensive heart disease without heart failure: Secondary | ICD-10-CM | POA: Diagnosis not present

## 2021-11-15 DIAGNOSIS — E663 Overweight: Secondary | ICD-10-CM

## 2021-11-15 DIAGNOSIS — Z23 Encounter for immunization: Secondary | ICD-10-CM | POA: Diagnosis not present

## 2021-11-15 DIAGNOSIS — R351 Nocturia: Secondary | ICD-10-CM | POA: Diagnosis not present

## 2021-11-15 DIAGNOSIS — I25810 Atherosclerosis of coronary artery bypass graft(s) without angina pectoris: Secondary | ICD-10-CM

## 2021-11-15 DIAGNOSIS — Z6829 Body mass index (BMI) 29.0-29.9, adult: Secondary | ICD-10-CM

## 2021-11-15 DIAGNOSIS — E1169 Type 2 diabetes mellitus with other specified complication: Secondary | ICD-10-CM

## 2021-11-15 DIAGNOSIS — I25708 Atherosclerosis of coronary artery bypass graft(s), unspecified, with other forms of angina pectoris: Secondary | ICD-10-CM | POA: Diagnosis not present

## 2021-11-15 LAB — POCT URINALYSIS DIPSTICK
Bilirubin, UA: NEGATIVE
Blood, UA: NEGATIVE
Glucose, UA: NEGATIVE
Ketones, UA: NEGATIVE
Leukocytes, UA: NEGATIVE
Nitrite, UA: NEGATIVE
Protein, UA: NEGATIVE
Spec Grav, UA: 1.01 (ref 1.010–1.025)
Urobilinogen, UA: 0.2 E.U./dL
pH, UA: 5 (ref 5.0–8.0)

## 2021-11-15 NOTE — Patient Instructions (Signed)

## 2021-11-15 NOTE — Progress Notes (Signed)
I,Katawbba Wiggins,acting as a Education administrator for Maximino Greenland, MD.,have documented all relevant documentation on the behalf of Maximino Greenland, MD,as directed by  Maximino Greenland, MD while in the presence of Maximino Greenland, MD.  This visit occurred during the SARS-CoV-2 public health emergency.  Safety protocols were in place, including screening questions prior to the visit, additional usage of staff PPE, and extensive cleaning of exam room while observing appropriate contact time as indicated for disinfecting solutions.  Subjective:     Patient ID: Darin Pitts , male    DOB: [birth year] , 66 y.o.   MRN: [id number]   Chief Complaint  Patient presents with   Diabetes   Hypertension    HPI  He presents today for physical examination. However, he was more than 15 min late for his appt. Therefore, we will do DM/HTN f/u.   He reports compliance with meds. He denies headaches, chest pain and shortness of breath.   Diabetes He presents for his follow-up diabetic visit. He has type 2 diabetes mellitus. His disease course has been improving. There are no hypoglycemic associated symptoms. There are no diabetic associated symptoms. There are no hypoglycemic complications. Diabetic complications include heart disease. Risk factors for coronary artery disease include diabetes mellitus, dyslipidemia, hypertension and male sex. He is compliant with treatment most of the time. He is following a diabetic diet. He participates in exercise three times a week. Eye exam is current.    Past Medical History:  Diagnosis Date   CAD (coronary artery disease) 2010   CABG x 1  DR. OWEN   Hyperglycemia      Family History  Problem Relation Age of Onset   Hypertension Father    Diabetes Father    Hypertension Mother    Stroke Mother    Diabetes Mother    Alzheimer's disease Mother    Hypertension Brother    Diabetes Brother      Current Outpatient Medications:    aspirin EC 81 MG tablet, Take 1  tablet (81 mg total) by mouth daily., Disp: , Rfl:    atorvastatin (LIPITOR) 20 MG tablet, Take 1 tablet (20 mg total) by mouth daily at 6 PM., Disp: 90 tablet, Rfl: 2   Empagliflozin-metFORMIN HCl ER (SYNJARDY XR) 25-1000 MG TB24, Take 25-1,000 mg by mouth daily at 6 (six) AM., Disp: 90 tablet, Rfl: 1   metoprolol tartrate (LOPRESSOR) 25 MG tablet, Take 1 tablet (25 mg total) by mouth 2 (two) times daily., Disp: 180 tablet, Rfl: 3   Multiple Vitamin (MULTIVITAMIN) tablet, Take 1 tablet by mouth daily., Disp: , Rfl:    ONETOUCH ULTRA test strip, CHECK BLOOD SUGAR DAILY, Disp: 100 strip, Rfl: 3   TRUEplus Lancets 30G MISC, USE AS DIRECTED TO CHECK  BLOOD SUGAR ONCE DAILY, Disp: 100 each, Rfl: 3   No Known Allergies   Review of Systems  Constitutional: Negative.   Respiratory: Negative.    Cardiovascular: Negative.   Gastrointestinal: Negative.   Neurological: Negative.   Psychiatric/Behavioral: Negative.      Today's Vitals   11/15/21 0925  BP: 124/66  Pulse: 87  Temp: 98.6 F (37 C)  Weight: 203 lb 3.2 oz (92.2 kg)  Height: 5' 10"  (1.778 m)   Body mass index is 29.16 kg/m.  Wt Readings from Last 3 Encounters:  11/15/21 203 lb 3.2 oz (92.2 kg)  09/20/21 203 lb (92.1 kg)  09/13/21 203 lb (92.1 kg)    BP Readings from Last 3  Encounters:  11/15/21 124/66  09/20/21 116/80  09/13/21 128/68    Objective:  Physical Exam Vitals and nursing note reviewed.  Constitutional:      Appearance: Normal appearance.  HENT:     Head: Normocephalic and atraumatic.     Nose:     Comments: Masked     Mouth/Throat:     Comments: Masked  Eyes:     Extraocular Movements: Extraocular movements intact.  Cardiovascular:     Rate and Rhythm: Normal rate and regular rhythm.     Heart sounds: Normal heart sounds.  Pulmonary:     Effort: Pulmonary effort is normal.     Breath sounds: Normal breath sounds.  Musculoskeletal:     Cervical back: Normal range of motion.  Skin:    General: Skin  is warm.  Neurological:     General: No focal deficit present.     Mental Status: He is alert.  Psychiatric:        Mood and Affect: Mood normal.        Assessment And Plan:     1. Type 2 diabetes mellitus with hyperlipidemia (HCC) Comments: Chronic, I will increase Synjardy to 25/1000mg once daily. He agrees to rto in six weeks to recheck renal function. He will rto in 3 months for DM check.  - CMP14+EGFR - CBC - Hemoglobin A1c - TSH - Microalbumin / Creatinine Urine Ratio - POCT Urinalysis Dipstick (81002)  2. Benign hypertensive heart disease without heart failure Comments: Chronic, well controlled. He is encouraged to follow low sodium diet. I will check renal function today.   3. Coronary artery disease of bypass graft of native heart with stable angina pectoris ([place]) Comments: Chronic, yet stable. He is on ASA, statin and B-blocker therapy. Importance of medication/dietary compliance was d/w patient.   4. Nocturia Comments: I will check PSA today.  - PSA  5. Overweight with body mass index (BMI) of 29 to 29.9 in adult  6. Immunization due Comments: He was given Shingrix IM x 1. He agrees to rto in 2-6 months for his second injection.  He is advised to exercise no less than 150 minutes per week.     Patient was given opportunity to ask questions. Patient verbalized understanding of the plan and was able to repeat key elements of the plan. All questions were answered to their satisfaction.   I, Maximino Greenland, MD, have reviewed all documentation for this visit. The documentation on 11/15/21 for the exam, diagnosis, procedures, and orders are all accurate and complete.   IF YOU HAVE BEEN REFERRED TO A SPECIALIST, IT MAY TAKE 1-2 WEEKS TO SCHEDULE/PROCESS THE REFERRAL. IF YOU HAVE NOT HEARD FROM US/SPECIALIST IN TWO WEEKS, PLEASE GIVE [place] A CALL AT [phone].   THE PATIENT IS ENCOURAGED TO PRACTICE SOCIAL DISTANCING DUE TO THE COVID-19 PANDEMIC.

## 2021-11-16 LAB — CBC
Hematocrit: 48.7 % (ref 37.5–51.0)
Hemoglobin: 16.1 g/dL (ref 13.0–17.7)
MCH: 29.5 pg (ref 26.6–33.0)
MCHC: 33.1 g/dL (ref 31.5–35.7)
MCV: 89 fL (ref 79–97)
Platelets: 198 10*3/uL (ref 150–450)
RBC: 5.45 x10E6/uL (ref 4.14–5.80)
RDW: 13.4 % (ref 11.6–15.4)
WBC: 6.3 10*3/uL (ref 3.4–10.8)

## 2021-11-16 LAB — HEMOGLOBIN A1C
Est. average glucose Bld gHb Est-mCnc: 163 mg/dL
Hgb A1c MFr Bld: 7.3 % — ABNORMAL HIGH (ref 4.8–5.6)

## 2021-11-16 LAB — CMP14+EGFR
ALT: 28 IU/L (ref 0–44)
AST: 24 IU/L (ref 0–40)
Albumin/Globulin Ratio: 2.1 (ref 1.2–2.2)
Albumin: 4.5 g/dL (ref 3.8–4.8)
Alkaline Phosphatase: 53 IU/L (ref 44–121)
BUN/Creatinine Ratio: 10 (ref 10–24)
BUN: 11 mg/dL (ref 8–27)
Bilirubin Total: 0.4 mg/dL (ref 0.0–1.2)
CO2: 24 mmol/L (ref 20–29)
Calcium: 9.5 mg/dL (ref 8.6–10.2)
Chloride: 103 mmol/L (ref 96–106)
Creatinine, Ser: 1.11 mg/dL (ref 0.76–1.27)
Globulin, Total: 2.1 g/dL (ref 1.5–4.5)
Glucose: 123 mg/dL — ABNORMAL HIGH (ref 70–99)
Potassium: 4.7 mmol/L (ref 3.5–5.2)
Sodium: 140 mmol/L (ref 134–144)
Total Protein: 6.6 g/dL (ref 6.0–8.5)
eGFR: 73 mL/min/{1.73_m2} (ref >59)

## 2021-11-16 LAB — PSA: Prostate Specific Ag, Serum: 0.4 ng/mL (ref 0.0–4.0)

## 2021-11-16 LAB — TSH: TSH: 1.01 u[IU]/mL (ref 0.450–4.500)

## 2021-11-17 LAB — MICROALBUMIN / CREATININE URINE RATIO
Creatinine, Urine: 96.2 mg/dL
Microalb/Creat Ratio: 4 mg/g creat (ref 0–29)
Microalbumin, Urine: 3.6 ug/mL

## 2021-12-01 ENCOUNTER — Telehealth: Payer: Self-pay | Admitting: Cardiovascular Disease

## 2021-12-01 DIAGNOSIS — I119 Hypertensive heart disease without heart failure: Secondary | ICD-10-CM

## 2021-12-01 DIAGNOSIS — E782 Mixed hyperlipidemia: Secondary | ICD-10-CM

## 2021-12-01 DIAGNOSIS — I25810 Atherosclerosis of coronary artery bypass graft(s) without angina pectoris: Secondary | ICD-10-CM

## 2021-12-01 NOTE — Telephone Encounter (Signed)
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

## 2021-12-01 NOTE — Telephone Encounter (Signed)
Pt had GXT in October so I called to verify that he would need another one this year.  Pt states this is correct.  They want him to have a stress test every year.  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  Advised I will send message to Dr. Elease Hashimoto for review and advisement.

## 2021-12-02 NOTE — Telephone Encounter (Signed)
Order placed for ETT as requested by Dr Elease Hashimoto.

## 2021-12-21 ENCOUNTER — Other Ambulatory Visit: Payer: Self-pay | Admitting: Internal Medicine

## 2021-12-27 ENCOUNTER — Other Ambulatory Visit: Payer: Self-pay

## 2021-12-27 ENCOUNTER — Other Ambulatory Visit: Payer: 59

## 2021-12-27 DIAGNOSIS — Z79899 Other long term (current) drug therapy: Secondary | ICD-10-CM

## 2021-12-27 LAB — BMP8+EGFR
BUN/Creatinine Ratio: 12 (ref 10–24)
BUN: 14 mg/dL (ref 8–27)
CO2: 20 mmol/L (ref 20–29)
Calcium: 9.6 mg/dL (ref 8.6–10.2)
Chloride: 101 mmol/L (ref 96–106)
Creatinine, Ser: 1.13 mg/dL (ref 0.76–1.27)
Glucose: 132 mg/dL — ABNORMAL HIGH (ref 70–99)
Potassium: 4.3 mmol/L (ref 3.5–5.2)
Sodium: 138 mmol/L (ref 134–144)
eGFR: 72 mL/min/{1.73_m2} (ref >59)

## 2022-02-17 ENCOUNTER — Ambulatory Visit (INDEPENDENT_AMBULATORY_CARE_PROVIDER_SITE_OTHER): Payer: 59 | Admitting: Internal Medicine

## 2022-02-17 ENCOUNTER — Other Ambulatory Visit: Payer: 59

## 2022-02-17 ENCOUNTER — Encounter: Payer: Self-pay | Admitting: Internal Medicine

## 2022-02-17 ENCOUNTER — Other Ambulatory Visit: Payer: Self-pay | Admitting: Internal Medicine

## 2022-02-17 VITALS — BP 116/80 | HR 78 | Ht 68.0 in | Wt 204.6 lb

## 2022-02-17 DIAGNOSIS — Z23 Encounter for immunization: Secondary | ICD-10-CM | POA: Diagnosis not present

## 2022-02-17 DIAGNOSIS — E785 Hyperlipidemia, unspecified: Secondary | ICD-10-CM

## 2022-02-17 DIAGNOSIS — E6609 Other obesity due to excess calories: Secondary | ICD-10-CM

## 2022-02-17 DIAGNOSIS — Z1211 Encounter for screening for malignant neoplasm of colon: Secondary | ICD-10-CM | POA: Diagnosis not present

## 2022-02-17 DIAGNOSIS — I25708 Atherosclerosis of coronary artery bypass graft(s), unspecified, with other forms of angina pectoris: Secondary | ICD-10-CM

## 2022-02-17 DIAGNOSIS — Z0000 Encounter for general adult medical examination without abnormal findings: Secondary | ICD-10-CM

## 2022-02-17 DIAGNOSIS — Z6831 Body mass index (BMI) 31.0-31.9, adult: Secondary | ICD-10-CM

## 2022-02-17 DIAGNOSIS — E1169 Type 2 diabetes mellitus with other specified complication: Secondary | ICD-10-CM | POA: Diagnosis not present

## 2022-02-17 DIAGNOSIS — I119 Hypertensive heart disease without heart failure: Secondary | ICD-10-CM | POA: Diagnosis not present

## 2022-02-17 LAB — POCT URINALYSIS DIPSTICK
Bilirubin, UA: NEGATIVE
Blood, UA: NEGATIVE
Glucose, UA: POSITIVE — AB
Ketones, UA: NEGATIVE
Leukocytes, UA: NEGATIVE
Nitrite, UA: NEGATIVE
Protein, UA: NEGATIVE
Spec Grav, UA: 1.02 (ref 1.010–1.025)
Urobilinogen, UA: 0.2 E.U./dL
pH, UA: 6 (ref 5.0–8.0)

## 2022-02-17 LAB — POC HEMOCCULT BLD/STL (OFFICE/1-CARD/DIAGNOSTIC)
Card #1 Date: 4132023
Fecal Occult Blood, POC: NEGATIVE

## 2022-02-17 NOTE — Progress Notes (Signed)
?Rich Brave Llittleton,acting as a Education administrator for Maximino Greenland, MD.,have documented all relevant documentation on the behalf of Maximino Greenland, MD,as directed by  Maximino Greenland, MD while in the presence of Maximino Greenland, MD.  ?This visit occurred during the SARS-CoV-2 public health emergency.  Safety protocols were in place, including screening questions prior to the visit, additional usage of staff PPE, and extensive cleaning of exam room while observing appropriate contact time as indicated for disinfecting solutions. ? ?Subjective:  ?  ? Patient ID: Darin Pitts , male    DOB: 03-16-1956 , 66 y.o.   MRN: [id number] ? ? ?Chief Complaint  ?Patient presents with  ? Annual Exam  ? ? ?HPI ? ?He is here today for a full physical examination. He reports compliance with meds. He has no specific concerns or complaints at this time. He denies headaches, chest pain and shortness of breath.  ? ?Diabetes ?He presents for his follow-up diabetic visit. He has type 2 diabetes mellitus. His disease course has been improving. There are no hypoglycemic associated symptoms. There are no diabetic associated symptoms. Pertinent negatives for diabetes include no blurred vision. There are no hypoglycemic complications. Diabetic complications include heart disease. Risk factors for coronary artery disease include diabetes mellitus, dyslipidemia, hypertension and male sex. He is compliant with treatment most of the time. He is following a diabetic diet. He participates in exercise three times a week. Eye exam is current.  ?Hypertension ?This is a chronic problem. The current episode started more than 1 year ago. The problem has been gradually improving since onset. The problem is controlled. Pertinent negatives include no blurred vision or palpitations. Risk factors for coronary artery disease include diabetes mellitus, dyslipidemia, obesity and male gender. Past treatments include beta blockers. The current treatment provides  moderate improvement. Hypertensive end-organ damage includes CAD/MI.   ? ?Past Medical History:  ?Diagnosis Date  ? CAD (coronary artery disease) 2010  ? CABG x 1  DR. OWEN  ? Hyperglycemia   ?  ? ?Family History  ?Problem Relation Age of Onset  ? Hypertension Father   ? Diabetes Father   ? Hypertension Mother   ? Stroke Mother   ? Diabetes Mother   ? Alzheimer's disease Mother   ? Hypertension Brother   ? Diabetes Brother   ? ? ? ?Current Outpatient Medications:  ?  aspirin EC 81 MG tablet, Take 1 tablet (81 mg total) by mouth daily., Disp: , Rfl:  ?  atorvastatin (LIPITOR) 20 MG tablet, Take 1 tablet (20 mg total) by mouth daily at 6 PM., Disp: 90 tablet, Rfl: 2 ?  Empagliflozin-metFORMIN HCl ER (SYNJARDY XR) 25-1000 MG TB24, Take 25-1,000 mg by mouth daily at 6 (six) AM., Disp: 90 tablet, Rfl: 1 ?  metoprolol tartrate (LOPRESSOR) 25 MG tablet, TAKE 1 TABLET BY MOUTH TWICE A DAY, Disp: 180 tablet, Rfl: 3 ?  Multiple Vitamin (MULTIVITAMIN) tablet, Take 1 tablet by mouth daily., Disp: , Rfl:  ?  ONETOUCH ULTRA test strip, CHECK BLOOD SUGAR DAILY, Disp: 100 strip, Rfl: 3  ? ?No Known Allergies  ? ?Men's preventive visit. Patient Health Questionnaire (PHQ-2) is  ?[place] Office Visit from 04/12/2021 in [facility]  ?PHQ-2 Total Score 0  ? ?  ?. Patient is on a heart healthy diet. Marital status: Married. Relevant history for alcohol use is:  ?Social History  ? ?Substance and Sexual Activity  ?Alcohol Use No  ?. Relevant history for tobacco  use is:  ?Social History  ? ?Tobacco Use  ?Smoking Status Never  ?Smokeless Tobacco Never  ?.  ? ?Review of Systems  ?Constitutional: Negative.   ?HENT: Negative.    ?Eyes: Negative.  Negative for blurred vision.  ?Respiratory: Negative.    ?Cardiovascular: Negative.  Negative for palpitations.  ?Gastrointestinal: Negative.   ?Endocrine: Negative.   ?Genitourinary: Negative.   ?Musculoskeletal: Negative.   ?Skin: Negative.   ?Neurological: Negative.    ?Hematological: Negative.   ?Psychiatric/Behavioral: Negative.     ? ?Today's Vitals  ? 02/17/22 0848  ?BP: 116/80  ?Pulse: 78  ?Weight: 204 lb 9.6 oz (92.8 kg)  ?Height: 5' 8"  (1.727 m)  ?PainSc: 0-No pain  ? ?Body mass index is 31.11 kg/m?.  ?Wt Readings from Last 3 Encounters:  ?02/17/22 204 lb 9.6 oz (92.8 kg)  ?11/15/21 203 lb 3.2 oz (92.2 kg)  ?09/20/21 203 lb (92.1 kg)  ?  ? ?Objective:  ?Physical Exam ?Vitals and nursing note reviewed.  ?Constitutional:   ?   Appearance: Normal appearance.  ?HENT:  ?   Head: Normocephalic and atraumatic.  ?   Right Ear: Tympanic membrane, ear canal and external ear normal.  ?   Left Ear: Tympanic membrane, ear canal and external ear normal.  ?   Nose:  ?   Comments: Masked  ?   Mouth/Throat:  ?   Comments: Masked  ?Eyes:  ?   Extraocular Movements: Extraocular movements intact.  ?   Conjunctiva/sclera: Conjunctivae normal.  ?   Pupils: Pupils are equal, round, and reactive to light.  ?Cardiovascular:  ?   Rate and Rhythm: Normal rate and regular rhythm.  ?   Pulses:     ?     Dorsalis pedis pulses are 1+ on the right side and 1+ on the left side.  ?     Posterior tibial pulses are 0 on the right side and 0 on the left side.  ?   Heart sounds: Normal heart sounds.  ?Pulmonary:  ?   Effort: Pulmonary effort is normal.  ?   Breath sounds: Normal breath sounds.  ?Chest:  ?Breasts: ?   Right: Normal. No swelling, bleeding, inverted nipple, mass or nipple discharge.  ?   Left: Normal. No swelling, bleeding, inverted nipple, mass or nipple discharge.  ?Abdominal:  ?   General: Abdomen is flat. Bowel sounds are normal.  ?   Palpations: Abdomen is soft.  ?Genitourinary: ?   Prostate: Normal.  ?   Rectum: Normal. Guaiac result negative.  ?Musculoskeletal:     ?   General: Normal range of motion.  ?   Cervical back: Normal range of motion and neck supple.  ?Feet:  ?   Right foot:  ?   Protective Sensation: 5 sites tested.  5 sites sensed.  ?   Skin integrity: Skin integrity normal.  ?    Toenail Condition: Right toenails are normal.  ?   Left foot:  ?   Protective Sensation: 5 sites tested.  5 sites sensed.  ?   Skin integrity: Skin integrity normal.  ?   Toenail Condition: Left toenails are normal.  ?Skin: ?   General: Skin is warm.  ?Neurological:  ?   General: No focal deficit present.  ?   Mental Status: He is alert.  ?Psychiatric:     ?   Mood and Affect: Mood normal.     ?   Behavior: Behavior normal.  ?    ?Assessment  And Plan:  ?  ?1. Encounter for general adult medical examination w/o abnormal findings ?Comments: A full exam was performed. DRE performed, stool is heme negative. PATIENT IS ADVISED TO GET 30-45 MINUTES REGULAR EXERCISE NO LESS THAN FOUR TO FIVE DAYS PER WEEK - BOTH WEIGHTBEARING EXERCISES AND AEROBIC ARE RECOMMENDED.  PATIENT IS ADVISED TO FOLLOW A HEALTHY DIET WITH AT LEAST SIX FRUITS/VEGGIES PER DAY, DECREASE INTAKE OF RED MEAT, AND TO INCREASE FISH INTAKE TO TWO DAYS PER WEEK.  MEATS/FISH SHOULD NOT BE FRIED, BAKED OR BROILED IS PREFERABLE.  IT IS ALSO IMPORTANT TO CUT BACK ON YOUR SUGAR INTAKE. PLEASE AVOID ANYTHING WITH ADDED SUGAR, CORN SYRUP OR OTHER SWEETENERS. IF YOU MUST USE A SWEETENER, YOU CAN TRY STEVIA. IT IS ALSO IMPORTANT TO AVOID ARTIFICIALLY SWEETENERS AND DIET BEVERAGES. LASTLY, I SUGGEST WEARING SPF 50 SUNSCREEN ON EXPOSED PARTS AND ESPECIALLY WHEN IN THE DIRECT SUNLIGHT FOR AN EXTENDED PERIOD OF TIME.  PLEASE AVOID FAST FOOD RESTAURANTS AND INCREASE YOUR WATER INTAKE. ? ?- CMP14+EGFR ?- Hemoglobin A1c ?- PSA ?- POC Hemoccult Bld/Stl (1-Cd Office Dx) ?- Varicella-zoster vaccine IM (Shingrix) ? ?2. Type 2 diabetes mellitus with hyperlipidemia ([place]) ?Comments: Diabetic foot exam was performed. He will rto in 4 months for re-evaluation. Last LDL, 58 drawn in Nov 2023. I will recheck at his next visit. I DISCUSSED WITH THE PATIENT AT LENGTH REGARDING THE GOALS OF GLYCEMIC CONTROL AND POSSIBLE LONG-TERM COMPLICATIONS.  I  ALSO STRESSED THE IMPORTANCE OF  COMPLIANCE WITH HOME GLUCOSE MONITORING, DIETARY RESTRICTIONS INCLUDING AVOIDANCE OF SUGARY DRINKS/PROCESSED FOODS,  ALONG WITH REGULAR EXERCISE.  I  ALSO STRESSED THE IMPORTANCE OF ANNUAL EYE EXAMS, SELF FOOT C

## 2022-02-17 NOTE — Patient Instructions (Signed)

## 2022-02-18 ENCOUNTER — Other Ambulatory Visit: Payer: 59

## 2022-02-18 LAB — LIPID PANEL
Chol/HDL Ratio: 2.8 ratio (ref 0.0–5.0)
Cholesterol, Total: 147 mg/dL (ref 100–199)
HDL: 52 mg/dL (ref >39)
LDL Chol Calc (NIH): 69 mg/dL (ref 0–99)
Triglycerides: 152 mg/dL — ABNORMAL HIGH (ref 0–149)
VLDL Cholesterol Cal: 26 mg/dL (ref 5–40)

## 2022-02-19 LAB — CMP14+EGFR
ALT: 30 IU/L (ref 0–44)
AST: 22 IU/L (ref 0–40)
Albumin/Globulin Ratio: 1.9 (ref 1.2–2.2)
Albumin: 4.6 g/dL (ref 3.8–4.8)
Alkaline Phosphatase: 60 IU/L (ref 44–121)
BUN/Creatinine Ratio: 17 (ref 10–24)
BUN: 19 mg/dL (ref 8–27)
Bilirubin Total: 0.3 mg/dL (ref 0.0–1.2)
CO2: 20 mmol/L (ref 20–29)
Calcium: 9.5 mg/dL (ref 8.6–10.2)
Chloride: 107 mmol/L — ABNORMAL HIGH (ref 96–106)
Creatinine, Ser: 1.11 mg/dL (ref 0.76–1.27)
Globulin, Total: 2.4 g/dL (ref 1.5–4.5)
Glucose: 131 mg/dL — ABNORMAL HIGH (ref 70–99)
Potassium: 4.2 mmol/L (ref 3.5–5.2)
Sodium: 141 mmol/L (ref 134–144)
Total Protein: 7 g/dL (ref 6.0–8.5)
eGFR: 73 mL/min/{1.73_m2} (ref >59)

## 2022-02-19 LAB — HEMOGLOBIN A1C
Est. average glucose Bld gHb Est-mCnc: 169 mg/dL
Hgb A1c MFr Bld: 7.5 % — ABNORMAL HIGH (ref 4.8–5.6)

## 2022-02-19 LAB — MICROALBUMIN / CREATININE URINE RATIO
Creatinine, Urine: 98.3 mg/dL
Microalb/Creat Ratio: <3 mg/g creat (ref 0–29)
Microalbumin, Urine: <3 ug/mL

## 2022-02-19 LAB — PSA: Prostate Specific Ag, Serum: 0.4 ng/mL (ref 0.0–4.0)

## 2022-04-06 ENCOUNTER — Other Ambulatory Visit: Payer: Self-pay | Admitting: Internal Medicine

## 2022-06-07 ENCOUNTER — Other Ambulatory Visit: Payer: Self-pay | Admitting: Internal Medicine

## 2022-06-27 ENCOUNTER — Encounter: Payer: Self-pay | Admitting: Internal Medicine

## 2022-06-27 ENCOUNTER — Ambulatory Visit: Payer: 59 | Admitting: Internal Medicine

## 2022-06-27 VITALS — BP 110/62 | HR 82 | Temp 98.0°F | Ht 68.0 in | Wt 202.6 lb

## 2022-06-27 DIAGNOSIS — E6609 Other obesity due to excess calories: Secondary | ICD-10-CM

## 2022-06-27 DIAGNOSIS — Z23 Encounter for immunization: Secondary | ICD-10-CM | POA: Diagnosis not present

## 2022-06-27 DIAGNOSIS — I119 Hypertensive heart disease without heart failure: Secondary | ICD-10-CM | POA: Diagnosis not present

## 2022-06-27 DIAGNOSIS — I25708 Atherosclerosis of coronary artery bypass graft(s), unspecified, with other forms of angina pectoris: Secondary | ICD-10-CM

## 2022-06-27 DIAGNOSIS — E1169 Type 2 diabetes mellitus with other specified complication: Secondary | ICD-10-CM

## 2022-06-27 DIAGNOSIS — E785 Hyperlipidemia, unspecified: Secondary | ICD-10-CM

## 2022-06-27 DIAGNOSIS — Z6831 Body mass index (BMI) 31.0-31.9, adult: Secondary | ICD-10-CM

## 2022-06-27 MED ORDER — SYNJARDY XR 25-1000 MG PO TB24
ORAL_TABLET | ORAL | 1 refills | Status: DC
Start: 2022-06-27 — End: 2022-10-17

## 2022-06-27 NOTE — Progress Notes (Signed)
I,Victoria T Hamilton,acting as a scribe for Maximino Greenland, MD.,have documented all relevant documentation on the behalf of Maximino Greenland, MD,as directed by  Maximino Greenland, MD while in the presence of Maximino Greenland, MD.    Subjective:     Patient ID: Darin Pitts , male    DOB: May 21, 1956 , 66 y.o.   MRN: [id number]   Chief Complaint  Patient presents with   Diabetes   Hypertension    HPI  Pt here today for DM/HTN f/u.   He reports compliance with meds. He denies headaches, chest pain and shortness of breath.   BP Readings from Last 3 Encounters: 06/27/22 : 110/62 02/17/22 : 116/80 11/15/21 : 124/66    Diabetes He presents for his follow-up diabetic visit. He has type 2 diabetes mellitus. His disease course has been improving. There are no hypoglycemic associated symptoms. There are no diabetic associated symptoms. There are no hypoglycemic complications. Diabetic complications include heart disease. Risk factors for coronary artery disease include diabetes mellitus, dyslipidemia, hypertension and male sex. He is compliant with treatment most of the time. He is following a diabetic diet. He participates in exercise three times a week. Eye exam is current.     Past Medical History:  Diagnosis Date   CAD (coronary artery disease) 2010   CABG x 1  DR. OWEN   Hyperglycemia      Family History  Problem Relation Age of Onset   Hypertension Father    Diabetes Father    Hypertension Mother    Stroke Mother    Diabetes Mother    Alzheimer's disease Mother    Hypertension Brother    Diabetes Brother      Current Outpatient Medications:    aspirin EC 81 MG tablet, Take 1 tablet (81 mg total) by mouth daily., Disp: , Rfl:    atorvastatin (LIPITOR) 20 MG tablet, TAKE 1 TABLET BY MOUTH DAILY AT 6 PM., Disp: 90 tablet, Rfl: 2   metoprolol tartrate (LOPRESSOR) 25 MG tablet, TAKE 1 TABLET BY MOUTH TWICE A DAY, Disp: 180 tablet, Rfl: 3   Multiple Vitamin (MULTIVITAMIN)  tablet, Take 1 tablet by mouth daily., Disp: , Rfl:    ONETOUCH ULTRA test strip, CHECK BLOOD SUGAR DAILY, Disp: 100 strip, Rfl: 3   TRUEplus Lancets 30G MISC, USE AS DIRECTED TO CHECK  BLOOD SUGAR ONCE DAILY, Disp: 100 each, Rfl: 3   Empagliflozin-metFORMIN HCl ER (SYNJARDY XR) 25-1000 MG TB24, TAKE 1 TABLET BY MOUTH EVERY DAY AT 6AM, Disp: 90 tablet, Rfl: 1   No Known Allergies   Review of Systems  Constitutional: Negative.   Respiratory: Negative.    Gastrointestinal: Negative.   Musculoskeletal: Negative.   Skin: Negative.   Allergic/Immunologic: Negative.   Hematological: Negative.      Today's Vitals   06/27/22 1008  BP: 110/62  Pulse: 82  Temp: 98 F (36.7 C)  TempSrc: Oral  Weight: 202 lb 9.6 oz (91.9 kg)  Height: 5' 8"  (1.727 m)  PainSc: 0-No pain   Body mass index is 30.81 kg/m.  Wt Readings from Last 3 Encounters:  06/27/22 202 lb 9.6 oz (91.9 kg)  02/17/22 204 lb 9.6 oz (92.8 kg)  11/15/21 203 lb 3.2 oz (92.2 kg)    Objective:  Physical Exam Vitals and nursing note reviewed.  Constitutional:      Appearance: Normal appearance.  HENT:     Head: Normocephalic and atraumatic.  Eyes:     Extraocular Movements:  Extraocular movements intact.  Cardiovascular:     Rate and Rhythm: Normal rate and regular rhythm.     Heart sounds: Normal heart sounds.  Pulmonary:     Effort: Pulmonary effort is normal.     Breath sounds: Normal breath sounds.  Musculoskeletal:     Cervical back: Normal range of motion.  Skin:    General: Skin is warm.  Neurological:     General: No focal deficit present.     Mental Status: He is alert.  Psychiatric:        Mood and Affect: Mood normal.         Assessment And Plan:     1. Type 2 diabetes mellitus with hyperlipidemia (HCC) Comments: Chronic, he is doing well with Synjardy XR. I will check renal function. He will rto in 56month for re-evaluation.  - CMP14+EGFR - Hemoglobin A1c  2. Benign hypertensive heart disease  without heart failure Comments: Chronic, well controlled. He will c/w metoprolol tartrate 227mbid.  3. Coronary artery disease of bypass graft of native heart with stable angina pectoris ([place]: Chronic, stable. LDL goal <70. He is s/p CABG in November 2010. He had neg stress test October 2022.  He is currently asymptomatic.   4. Class 1 obesity due to excess calories with serious comorbidity and body mass index (BMI) of 31.0 to 31.9 in adult Comments: He is encouraged to aim for at least 150 minutes of exercise per week.   5. Immunization due - Pneumococcal conjugate vaccine 20-valent (Prevnar-20)   Patient was given opportunity to ask questions. Patient verbalized understanding of the plan and was able to repeat key elements of the plan. All questions were answered to their satisfaction.   I, RoMaximino GreenlandMD, have reviewed all documentation for this visit. The documentation on 06/27/22 for the exam, diagnosis, procedures, and orders are all accurate and complete.   IF YOU HAVE BEEN REFERRED TO A SPECIALIST, IT MAY TAKE 1-2 WEEKS TO SCHEDULE/PROCESS THE REFERRAL. IF YOU HAVE NOT HEARD FROM US/SPECIALIST IN TWO WEEKS, PLEASE GIVE [place] CALL AT [phone] X [phone].   THE PATIENT IS ENCOURAGED TO PRACTICE SOCIAL DISTANCING DUE TO THE COVID-19 PANDEMIC.

## 2022-06-27 NOTE — Patient Instructions (Signed)

## 2022-06-28 LAB — CMP14+EGFR
ALT: 27 IU/L (ref 0–44)
AST: 22 IU/L (ref 0–40)
Albumin/Globulin Ratio: 1.9 (ref 1.2–2.2)
Albumin: 4.4 g/dL (ref 3.9–4.9)
Alkaline Phosphatase: 55 IU/L (ref 44–121)
BUN/Creatinine Ratio: 13 (ref 10–24)
BUN: 13 mg/dL (ref 8–27)
Bilirubin Total: 0.4 mg/dL (ref 0.0–1.2)
CO2: 20 mmol/L (ref 20–29)
Calcium: 9.4 mg/dL (ref 8.6–10.2)
Chloride: 107 mmol/L — ABNORMAL HIGH (ref 96–106)
Creatinine, Ser: 1.04 mg/dL (ref 0.76–1.27)
Globulin, Total: 2.3 g/dL (ref 1.5–4.5)
Glucose: 125 mg/dL — ABNORMAL HIGH (ref 70–99)
Potassium: 4.4 mmol/L (ref 3.5–5.2)
Sodium: 142 mmol/L (ref 134–144)
Total Protein: 6.7 g/dL (ref 6.0–8.5)
eGFR: 79 mL/min/{1.73_m2} (ref >59)

## 2022-06-28 LAB — HEMOGLOBIN A1C
Est. average glucose Bld gHb Est-mCnc: 166 mg/dL
Hgb A1c MFr Bld: 7.4 % — ABNORMAL HIGH (ref 4.8–5.6)

## 2022-08-03 ENCOUNTER — Ambulatory Visit: Payer: 59 | Attending: Cardiology

## 2022-08-03 DIAGNOSIS — I119 Hypertensive heart disease without heart failure: Secondary | ICD-10-CM | POA: Diagnosis not present

## 2022-08-03 DIAGNOSIS — I25810 Atherosclerosis of coronary artery bypass graft(s) without angina pectoris: Secondary | ICD-10-CM | POA: Diagnosis not present

## 2022-08-03 DIAGNOSIS — E782 Mixed hyperlipidemia: Secondary | ICD-10-CM

## 2022-08-03 LAB — EXERCISE TOLERANCE TEST
Angina Index: 0
Duke Treadmill Score: 12
Estimated workload: 13.7
Exercise duration (min): 12 min
Exercise duration (sec): 0 s
MPHR: 154 {beats}/min
Peak HR: 157 {beats}/min
Percent HR: 101 %
RPE: 17
Rest HR: 93 {beats}/min
ST Depression (mm): 0 mm

## 2022-08-04 ENCOUNTER — Telehealth: Payer: Self-pay | Admitting: Cardiovascular Disease

## 2022-08-04 ENCOUNTER — Encounter: Payer: Self-pay | Admitting: Cardiovascular Disease

## 2022-08-04 NOTE — Telephone Encounter (Signed)
Received call from patient requesting DOT clearance letter.  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

## 2022-08-04 NOTE — Telephone Encounter (Signed)
Patient called to talk with Dr. Nishan or nurse. Please call back 

## 2022-08-04 NOTE — Telephone Encounter (Signed)
Left message to call back  

## 2022-10-17 ENCOUNTER — Ambulatory Visit: Payer: 59 | Admitting: Internal Medicine

## 2022-10-17 ENCOUNTER — Encounter: Payer: Self-pay | Admitting: Internal Medicine

## 2022-10-17 VITALS — BP 120/84 | HR 79 | Temp 98.2°F | Ht 66.8 in | Wt 202.6 lb

## 2022-10-17 DIAGNOSIS — E785 Hyperlipidemia, unspecified: Secondary | ICD-10-CM

## 2022-10-17 DIAGNOSIS — Z6831 Body mass index (BMI) 31.0-31.9, adult: Secondary | ICD-10-CM

## 2022-10-17 DIAGNOSIS — E6609 Other obesity due to excess calories: Secondary | ICD-10-CM | POA: Diagnosis not present

## 2022-10-17 DIAGNOSIS — Z23 Encounter for immunization: Secondary | ICD-10-CM

## 2022-10-17 DIAGNOSIS — I119 Hypertensive heart disease without heart failure: Secondary | ICD-10-CM | POA: Diagnosis not present

## 2022-10-17 DIAGNOSIS — E1169 Type 2 diabetes mellitus with other specified complication: Secondary | ICD-10-CM | POA: Diagnosis not present

## 2022-10-17 MED ORDER — ATORVASTATIN CALCIUM 20 MG PO TABS: ORAL_TABLET | ORAL | 2 refills | Status: DC

## 2022-10-17 MED ORDER — SYNJARDY XR 25-1000 MG PO TB24: ORAL_TABLET | ORAL | 1 refills | Status: DC

## 2022-10-17 NOTE — Patient Instructions (Signed)

## 2022-10-17 NOTE — Progress Notes (Unsigned)
Rich Brave Llittleton,acting as a Education administrator for Maximino Greenland, MD.,have documented all relevant documentation on the behalf of Maximino Greenland, MD,as directed by  Maximino Greenland, MD while in the presence of Maximino Greenland, MD.    Subjective:     Patient ID: Darin Pitts , male    DOB: 20-Aug-1956 , 66 y.o.   MRN: [id number]   Chief Complaint  Patient presents with   Diabetes    HPI  Pt here today for DM/HTN f/u.   He reports compliance with meds. He denies headaches, chest pain and shortness of breath.   Diabetes He presents for his follow-up diabetic visit. He has type 2 diabetes mellitus. His disease course has been improving. There are no hypoglycemic associated symptoms. There are no diabetic associated symptoms. Pertinent negatives for diabetes include no polydipsia, no polyphagia and no polyuria. There are no hypoglycemic complications. Diabetic complications include heart disease. Risk factors for coronary artery disease include diabetes mellitus, dyslipidemia, hypertension and male sex. He is compliant with treatment most of the time. He is following a diabetic diet. He participates in exercise three times a week. Eye exam is current.     Past Medical History:  Diagnosis Date   CAD (coronary artery disease) 2010   CABG x 1  DR. OWEN   Hyperglycemia      Family History  Problem Relation Age of Onset   Hypertension Father    Diabetes Father    Hypertension Mother    Stroke Mother    Diabetes Mother    Alzheimer's disease Mother    Hypertension Brother    Diabetes Brother      Current Outpatient Medications:    aspirin EC 81 MG tablet, Take 1 tablet (81 mg total) by mouth daily., Disp: , Rfl:    metoprolol tartrate (LOPRESSOR) 25 MG tablet, TAKE 1 TABLET BY MOUTH TWICE A DAY, Disp: 180 tablet, Rfl: 3   Multiple Vitamin (MULTIVITAMIN) tablet, Take 1 tablet by mouth daily., Disp: , Rfl:    ONETOUCH ULTRA test strip, CHECK BLOOD SUGAR DAILY, Disp: 100 strip, Rfl:  3   TRUEplus Lancets 30G MISC, USE AS DIRECTED TO CHECK  BLOOD SUGAR ONCE DAILY, Disp: 100 each, Rfl: 3   atorvastatin (LIPITOR) 20 MG tablet, TAKE 1 TABLET BY MOUTH DAILY AT 6 PM., Disp: 90 tablet, Rfl: 2   Empagliflozin-metFORMIN HCl ER (SYNJARDY XR) 25-1000 MG TB24, TAKE 1 TABLET BY MOUTH EVERY DAY AT 6AM, Disp: 90 tablet, Rfl: 1   No Known Allergies   Review of Systems  Constitutional: Negative.   Eyes: Negative.   Respiratory: Negative.    Cardiovascular: Negative.   Gastrointestinal: Negative.   Endocrine: Negative for polydipsia, polyphagia and polyuria.  Musculoskeletal: Negative.   Skin: Negative.   Neurological: Negative.   Psychiatric/Behavioral: Negative.       Today's Vitals   10/17/22 1442  BP: 120/84  Pulse: 79  Temp: 98.2 F (36.8 C)  Weight: 202 lb 9.6 oz (91.9 kg)  Height: 5' 6.8" (1.697 m)  PainSc: 0-No pain   Body mass index is 31.92 kg/m.   BP Readings from Last 3 Encounters:  10/17/22 120/84  06/27/22 110/62  02/17/22 116/80    Objective:  Physical Exam Vitals and nursing note reviewed.  Constitutional:      Appearance: Normal appearance.  HENT:     Head: Normocephalic and atraumatic.     Nose:     Comments: Masked     Mouth/Throat:  Comments: Masked  Eyes:     Extraocular Movements: Extraocular movements intact.  Cardiovascular:     Rate and Rhythm: Normal rate and regular rhythm.     Heart sounds: Normal heart sounds.  Pulmonary:     Effort: Pulmonary effort is normal.     Breath sounds: Normal breath sounds.  Skin:    General: Skin is warm.  Neurological:     General: No focal deficit present.     Mental Status: He is alert.  Psychiatric:        Mood and Affect: Mood normal.      Assessment And Plan:     1. Type 2 diabetes mellitus with hyperlipidemia (HCC) Comments: Chronic, I will request his most recent eye exam. He will c/w Synjardy. LDL goal <70, he will c/w atorvastatin 7m daily. F/u 3 months. - Hemoglobin  A1c  2. Benign hypertensive heart disease without heart failure Comments: Chronic, well controlled. He will c/w metoprolol tartrate 25g twice daily. He is reminded to follow heart healthy lifestyle. - BMP8+EGFR  3. Class 1 obesity due to excess calories with serious comorbidity and body mass index (BMI) of 31.0 to 31.9 in adult Comments: He is encouraged to aim for at least 150 minutes of exercise per week.  4. Immunization due - Flu Vaccine QUAD High Dose(Fluad)   Patient was given opportunity to ask questions. Patient verbalized understanding of the plan and was able to repeat key elements of the plan. All questions were answered to their satisfaction.   I, RMaximino Greenland MD, have reviewed all documentation for this visit. The documentation on 10/17/22 for the exam, diagnosis, procedures, and orders are all accurate and complete.   IF YOU HAVE BEEN REFERRED TO A SPECIALIST, IT MAY TAKE 1-2 WEEKS TO SCHEDULE/PROCESS THE REFERRAL. IF YOU HAVE NOT HEARD FROM US/SPECIALIST IN TWO WEEKS, PLEASE GIVE [place] CALL AT [phone].   THE PATIENT IS ENCOURAGED TO PRACTICE SOCIAL DISTANCING DUE TO THE COVID-19 PANDEMIC.

## 2022-10-18 LAB — BMP8+EGFR
BUN/Creatinine Ratio: 13 (ref 10–24)
BUN: 15 mg/dL (ref 8–27)
CO2: 20 mmol/L (ref 20–29)
Calcium: 9.5 mg/dL (ref 8.6–10.2)
Chloride: 105 mmol/L (ref 96–106)
Creatinine, Ser: 1.12 mg/dL (ref 0.76–1.27)
Glucose: 131 mg/dL — ABNORMAL HIGH (ref 70–99)
Potassium: 4.6 mmol/L (ref 3.5–5.2)
Sodium: 140 mmol/L (ref 134–144)
eGFR: 72 mL/min/{1.73_m2} (ref >59)

## 2022-10-18 LAB — HEMOGLOBIN A1C
Est. average glucose Bld gHb Est-mCnc: 160 mg/dL
Hgb A1c MFr Bld: 7.2 % — ABNORMAL HIGH (ref 4.8–5.6)

## 2022-10-21 ENCOUNTER — Ambulatory Visit
Admission: EM | Admit: 2022-10-21 | Discharge: 2022-10-21 | Disposition: A | Discharge status: Home / Self Care | Payer: 59

## 2022-10-21 DIAGNOSIS — B9689 Other specified bacterial agents as the cause of diseases classified elsewhere: Secondary | ICD-10-CM | POA: Diagnosis not present

## 2022-10-21 DIAGNOSIS — H109 Unspecified conjunctivitis: Secondary | ICD-10-CM

## 2022-10-21 NOTE — ED Provider Notes (Signed)
[name]    CSN: [id number] Arrival date & time: 10/21/22  1206      History   Chief Complaint Chief Complaint  Patient presents with   Conjunctivitis    HPI Darin Pitts is a 66 y.o. male.   Patient presents with bilateral eye irritation, discomfort, drainage that started yesterday.  Patient denies any trauma or foreign body to the eye.  He does not wear contacts but does wear glasses.  Denies any known sick contacts.  He does report some associated nasal congestion as well.  Patient reports that he has a film over his eyes but denies any obvious blurry vision.  He is having copious amounts of purulent drainage.   Conjunctivitis    Past Medical History:  Diagnosis Date   CAD (coronary artery disease) 2010   CABG x 1  DR. OWEN   Hyperglycemia     Patient Active Problem List   Diagnosis Date Noted   Benign hypertensive heart disease without heart failure 04/22/2019   Class 1 obesity due to excess calories with serious comorbidity and body mass index (BMI) of 31.0 to 31.9 in adult 04/22/2019   Hyperlipidemia 09/21/2015   CAD (coronary artery disease)    Type 2 diabetes mellitus with hyperlipidemia (HCC)     Past Surgical History:  Procedure Laterality Date   CORONARY ARTERY BYPASS GRAFT  2010   HEMORRHOID SURGERY  2002       Home Medications    Prior to Admission medications   Medication Sig Start Date End Date Taking? Authorizing Provider  aspirin EC 81 MG tablet Take 1 tablet (81 mg total) by mouth daily. 09/15/14   Nahser, Deloris Ping, MD  atorvastatin (LIPITOR) 20 MG tablet TAKE 1 TABLET BY MOUTH DAILY AT 6 PM. 10/17/22   Dorothyann Peng, MD  Empagliflozin-metFORMIN HCl ER (SYNJARDY XR) 25-1000 MG TB24 TAKE 1 TABLET BY MOUTH EVERY DAY AT 6AM 10/17/22   Dorothyann Peng, MD  metoprolol tartrate (LOPRESSOR) 25 MG tablet TAKE 1 TABLET BY MOUTH TWICE A DAY 12/22/21   Dorothyann Peng, MD  Multiple Vitamin (MULTIVITAMIN) tablet Take 1 tablet by  mouth daily.    [provider]  [facility] ULTRA test strip CHECK BLOOD SUGAR DAILY 06/14/22   Dorothyann Peng, MD  TRUEplus Lancets 30G MISC USE AS DIRECTED TO CHECK  BLOOD SUGAR ONCE DAILY 06/14/22   Dorothyann Peng, MD    Family History Family History  Problem Relation Age of Onset   Hypertension Father    Diabetes Father    Hypertension Mother    Stroke Mother    Diabetes Mother    Alzheimer's disease Mother    Hypertension Brother    Diabetes Brother     Social History Social History   Tobacco Use   Smoking status: Never   Smokeless tobacco: Never  Vaping Use   Vaping Use: Never used  Substance Use Topics   Alcohol use: No   Drug use: No     Allergies   Patient has no known allergies.   Review of Systems Review of Systems Per HPI  Physical Exam Triage Vital Signs ED Triage Vitals [10/21/22 1400]  Enc Vitals Group     BP 115/69     Pulse Rate 80     Resp 16     Temp 98 F (36.7 C)     Temp src      SpO2 98 %     Weight      Height  Head Circumference      Peak Flow      Pain Score      Pain Loc      Pain Edu?      Excl. in GC?    No data found.  Updated Vital Signs BP 115/69   Pulse 80   Temp 98 F (36.7 C)   Resp 16   SpO2 98%   Visual Acuity Right Eye Distance: 20/25 Left Eye Distance: 20/25 Bilateral Distance: 20/25  Right Eye Near:   Left Eye Near:    Bilateral Near:     Physical Exam Constitutional:      General: He is not in acute distress.    Appearance: Normal appearance. He is not toxic-appearing or diaphoretic.  HENT:     Head: Normocephalic and atraumatic.  Eyes:     General: Lids are everted, no foreign bodies appreciated. Vision grossly intact. Gaze aligned appropriately.     Extraocular Movements: Extraocular movements intact.     Conjunctiva/sclera:     Right eye: Right conjunctiva is injected. Chemosis and exudate present. No hemorrhage.    Left eye: Left conjunctiva is injected. Chemosis and exudate  present. No hemorrhage.    Pupils: Pupils are equal, round, and reactive to light.     Comments: Patient has significant amount of copious purulent drainage coming from bilateral conjunctiva.  Patient also has moderate swelling and erythema present to bilateral upper and lower eyelids.  Entirety of sclera and eye are erythematous.  Pulmonary:     Effort: Pulmonary effort is normal.  Neurological:     General: No focal deficit present.     Mental Status: He is alert and oriented to person, place, and time. Mental status is at baseline.  Psychiatric:        Mood and Affect: Mood normal.        Behavior: Behavior normal.        Thought Content: Thought content normal.        Judgment: Judgment normal.      UC Treatments / Results  Labs (all labs ordered are listed, but only abnormal results are displayed) Labs Reviewed - No data to display  EKG   Radiology No results found.  Procedures Procedures (including critical care time)  Medications Ordered in UC Medications - No data to display  Initial Impression / Assessment and Plan / UC Course  I have reviewed the triage vital signs and the nursing notes.  Pertinent labs & imaging results that were available during my care of the patient were reviewed by me and considered in my medical decision making (see chart for details).     Physical exam is consistent with significant bilateral bacterial conjunctivitis.  Patient has significant swelling and copious purulent drainage which is concerning that patient needs ophthalmology evaluation as soon as possible.  Visual acuity appears normal so this is reassuring.  Called Dr. Darcel Bayley with on-call ophthalmology and he advised patient to be seen at [facility] today by ophthalmology.  Dr. Darcel Bayley advised for patient to go straight to [facility] as a walk-in today.  Advised patient of this and he was agreeable with plan.  Provided patient with contact information and  address and he was advised to go straight to the ophthalmology office.  Patient verbalized understanding and was agreeable with plan. Final Clinical Impressions(s) / UC Diagnoses   Final diagnoses:  Bacterial conjunctivitis of both eyes     Discharge Instructions  Go straight to [facility] as soon as you leave urgent care for further evaluation and management.    ED Prescriptions   None    PDMP not reviewed this encounter.   Gustavus Bryant, [place] 10/21/22 1419

## 2022-10-21 NOTE — ED Triage Notes (Signed)
Pt presents to uc with bilateral eye drainage and pain for 2 days. Pt reports otc eye drops and allergy medication for symptoms.

## 2022-10-21 NOTE — Discharge Instructions (Signed)
Go straight to [facility] as soon as you leave urgent care for further evaluation and management.

## 2022-11-24 ENCOUNTER — Encounter: Payer: Self-pay | Admitting: Cardiovascular Disease

## 2022-11-24 NOTE — Progress Notes (Signed)
Darin Pitts Date of Birth  May 28, 1956       Darin Pitts. [address], [place], [place]  [address] [phone]   Fax  [phone]    Problem List: 1. Coronary artery disease-status post CABG by Dr. Roxy Manns in November, 2010 2. Diabetes Mellitus 3. Hyperlipidemia    Darin Pitts is a 67 y.o. gentleman with a history of coronary artery disease. He has not had any difficulty since his bypass grafting in 2010. 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  He tries to eat a low fat diet.  Nov. 3, 2014:  Darin Pitts has been seen by Cecille Rubin since I last saw him.   No complaints.  He still works as a Administrator for [employer].    He is exercising regularly.   Nov. 9, 2015:  Darin Pitts is doing well.  No angina.  Working out regularly. No issues. Doing well  is fasting for lab work  Nov. 14, 2016:   Doing great . Works out regularly , no CP    Nov. 20, 2017:  Darin Pitts is seen for follow up visit . Working out regularly. No CP or dyspnea.   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   October 18, 2017:  Doing well .   Working out regularly .   October 22, 2018: Darin Pitts  is seen today for a follow-up visit. History of coronary artery disease and is status post coronary artery bypass grafting in 2010.  He has a history of hyperlipidemia. Works out regularly  Recent labs look great   October 08, 2019:  Darin Pitts is seen today for follow-up visit.  He has a history of CAD and status post CABG in 2010.  He has a history of hyperlipidemia. No cp or dyspnea  Works out regularly .    Drives for [employer]  He brought a note from Dr. Tammi Klippel requesting exercise treadmill test and an updated ejection fraction which is apparently needed every 5 years.  He also is requesting a letter of cardiac clearance for [place].  Oct. 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  Still working out No angina , no limitations with working out  Nov. 7, 2022: Darin Pitts is seen for follow up of his coronary artery disease, CABG, hyperlipidemia. We recently did a CDL treadmill stress test during which he walked for 9 minutes.  He achieved a peak heart rate of 141 which is 90% predicted maximal heart rate.  He achieved 10.1 METS.  There were no ST or T wave changes.  The stress test was negative. No CP , no dyspnea . , BP is well controlled.   Jan. 19, 2024 Darin Pitts is seen for follow up of his CAD, CABG, HLD Had an ETT Sept. 27, 2023 .  He walked for 12 minutes,  max HR of 157.   13.7 mets .  No signs of ischemia   Work is going well  No CP , no dyspnea   His last hemoglobin A1c is 7.2.  I advised him to work on cutting down on his carbohydrates-foods that are white, wheat, sweet.  He will need a GXT in September 2024. Will check labs today including lipids, ALT, basic metabolic profile, LP(a)      Current Outpatient Medications on File Prior to Visit  Medication Sig Dispense Refill   aspirin EC 81 MG tablet Take 1 tablet (81 mg total) by mouth daily.     atorvastatin (LIPITOR) 20 MG tablet TAKE 1 TABLET BY MOUTH DAILY AT 6 PM. 90 tablet 2   Empagliflozin-metFORMIN HCl ER (SYNJARDY XR) 25-1000 MG TB24 TAKE 1 TABLET BY MOUTH EVERY DAY AT 6AM 90 tablet 1   metoprolol tartrate (LOPRESSOR) 25 MG tablet TAKE 1 TABLET BY MOUTH TWICE A DAY 180 tablet 3   Multiple Vitamin (MULTIVITAMIN) tablet Take 1 tablet by mouth daily.     ONETOUCH ULTRA test strip CHECK BLOOD SUGAR DAILY 100 strip 3   TRUEplus Lancets 30G MISC USE AS DIRECTED TO CHECK  BLOOD SUGAR ONCE DAILY 100 each 3   No current facility-administered medications on file prior to visit.    No Known Allergies  Past Medical History:  Diagnosis Date    CAD (coronary artery disease) 2010   CABG x 1  DR. OWEN   Hyperglycemia     Past Surgical History:  Procedure Laterality Date   CORONARY ARTERY BYPASS GRAFT  2010   HEMORRHOID SURGERY  2002    Social History   Tobacco Use  Smoking Status Never  Smokeless Tobacco Never    Social History   Substance and Sexual Activity  Alcohol Use No    Family History  Problem Relation Age of Onset   Hypertension Father    Diabetes Father    Hypertension Mother    Stroke Mother    Diabetes Mother    Alzheimer's disease Mother    Hypertension Brother    Diabetes Brother     Reviw of Systems:  Reviewed in the HPI.  All other systems are negative.  Physical Exam: Blood pressure 112/70, pulse 84, height 5\' 10"  (1.778 m), weight 203 lb 3.2 oz (92.2 kg), SpO2 97 %.       GEN:  middle age man  in no acute distress HEENT: Normal NECK: No JVD; No carotid bruits LYMPHATICS: No lymphadenopathy CARDIAC: RRR , no murmurs, rubs, gallops RESPIRATORY:  Clear to auscultation without rales, wheezing or rhonchi  ABDOMEN: Soft, non-tender, non-distended MUSCULOSKELETAL:  No edema; No deformity  SKIN: Warm and dry NEUROLOGIC:  Alert and oriented x 3     ECG:      Assessment / Plan:    1. Coronary artery disease-   he did well on his last stress test.  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 .    2. Diabetes Mellitus -   hemoglobin A1c is elevated.  He admits eating lots of carbohydrates.  I have asked him to cut back on his carbohydrates-foods that are white, wheat, sweet.  I encouraged him to exercise on a regular basis.  3. Hyperlipidemia-   check lipids today.  Check LP(a).  Continue current medications for now.    Mertie Moores, MD  11/25/2022 9:42 AM    Darin Pitts,  [place], [place]  [address] Pager [phone] Phone: [phone]; Fax: [phone]

## 2022-11-25 ENCOUNTER — Ambulatory Visit: Payer: 59 | Attending: Cardiovascular Disease | Admitting: Cardiovascular Disease

## 2022-11-25 ENCOUNTER — Encounter: Payer: Self-pay | Admitting: Cardiovascular Disease

## 2022-11-25 VITALS — BP 112/70 | HR 84 | Ht 70.0 in | Wt 203.2 lb

## 2022-11-25 DIAGNOSIS — I25810 Atherosclerosis of coronary artery bypass graft(s) without angina pectoris: Secondary | ICD-10-CM | POA: Diagnosis not present

## 2022-11-25 DIAGNOSIS — Z024 Encounter for examination for driving license: Secondary | ICD-10-CM

## 2022-11-25 DIAGNOSIS — E782 Mixed hyperlipidemia: Secondary | ICD-10-CM | POA: Diagnosis not present

## 2022-11-25 NOTE — Patient Instructions (Signed)
Medication Instructions:  Your physician recommends that you continue on your current medications as directed. Please refer to the Current Medication list given to you today.  *If you need a refill on your cardiac medications before your next appointment, please call your pharmacy*   Lab Work: Lipids, ALT, BMET, Lipoprotein (a) today If you have labs (blood work) drawn today and your tests are completely normal, you will receive your results only by: [facility] (if you have MyChart) OR A paper copy in the mail If you have any lab test that is abnormal or we need to change your treatment, we will call you to review the results.   Testing/Procedures: GXT/Exercise Stress test Your physician has requested that you have an exercise tolerance test. For further information please visit [email or web address]. Please also follow instruction sheet, as given.  Follow-Up: At [facility], you and your health needs are our priority.  As part of our continuing mission to provide you with exceptional heart care, we have created designated Provider Care Teams.  These Care Teams include your primary Cardiologist (physician) and Advanced Practice Providers (APPs -  Physician Assistants and Nurse Practitioners) who all work together to provide you with the care you need, when you need it.  Your next appointment:   1 year(s)  Provider:   Mertie Moores, MD      Other Instructions See separate sheet for stress test instructions

## 2022-11-26 LAB — LIPID PANEL
Chol/HDL Ratio: 2.8 ratio (ref 0.0–5.0)
Cholesterol, Total: 127 mg/dL (ref 100–199)
HDL: 45 mg/dL (ref >39)
LDL Chol Calc (NIH): 60 mg/dL (ref 0–99)
Triglycerides: 124 mg/dL (ref 0–149)
VLDL Cholesterol Cal: 22 mg/dL (ref 5–40)

## 2022-11-26 LAB — ALT: ALT: 29 IU/L (ref 0–44)

## 2022-11-26 LAB — BASIC METABOLIC PANEL
BUN/Creatinine Ratio: 14 (ref 10–24)
BUN: 16 mg/dL (ref 8–27)
CO2: 20 mmol/L (ref 20–29)
Calcium: 9.9 mg/dL (ref 8.6–10.2)
Chloride: 105 mmol/L (ref 96–106)
Creatinine, Ser: 1.13 mg/dL (ref 0.76–1.27)
Glucose: 160 mg/dL — ABNORMAL HIGH (ref 70–99)
Potassium: 4.2 mmol/L (ref 3.5–5.2)
Sodium: 140 mmol/L (ref 134–144)
eGFR: 71 mL/min/{1.73_m2} (ref >59)

## 2022-11-26 LAB — LIPOPROTEIN A (LPA): Lipoprotein (a): 71.4 nmol/L (ref <75.0)

## 2023-02-20 ENCOUNTER — Encounter: Payer: Self-pay | Admitting: Internal Medicine

## 2023-02-20 ENCOUNTER — Ambulatory Visit: Payer: 59 | Admitting: Internal Medicine

## 2023-02-20 VITALS — BP 106/78 | HR 77 | Temp 97.8°F | Ht 70.0 in | Wt 201.2 lb

## 2023-02-20 DIAGNOSIS — I25810 Atherosclerosis of coronary artery bypass graft(s) without angina pectoris: Secondary | ICD-10-CM | POA: Diagnosis not present

## 2023-02-20 DIAGNOSIS — E1169 Type 2 diabetes mellitus with other specified complication: Secondary | ICD-10-CM

## 2023-02-20 DIAGNOSIS — E785 Hyperlipidemia, unspecified: Secondary | ICD-10-CM | POA: Diagnosis not present

## 2023-02-20 DIAGNOSIS — I119 Hypertensive heart disease without heart failure: Secondary | ICD-10-CM

## 2023-02-20 DIAGNOSIS — Z6828 Body mass index (BMI) 28.0-28.9, adult: Secondary | ICD-10-CM

## 2023-02-20 MED ORDER — METOPROLOL TARTRATE 25 MG PO TABS: 25.0000 mg | ORAL_TABLET | Freq: Two times a day (BID) | ORAL | 3 refills | Status: DC

## 2023-02-20 NOTE — Progress Notes (Signed)
I,Victoria T Hamilton,acting as a scribe for Gwynneth Aliment, MD.,have documented all relevant documentation on the behalf of Gwynneth Aliment, MD,as directed by  Gwynneth Aliment, MD while in the presence of Gwynneth Aliment, MD.    Subjective:     Patient ID: Darin Pitts , male    DOB: August 30, 1956 , 67 y.o.   MRN: [id number]   Chief Complaint  Patient presents with   Diabetes   Hypertension    HPI  Pt here today for DM/HTN f/u.   He reports compliance with meds. He denies headaches, chest pain and shortness of breath. He reports his BS have been running from 100-160. He reports no specific questions or concerns.     Diabetes He presents for his follow-up diabetic visit. He has type 2 diabetes mellitus. His disease course has been improving. There are no hypoglycemic associated symptoms. There are no diabetic associated symptoms. Pertinent negatives for diabetes include no blurred vision, no polydipsia, no polyphagia and no polyuria. There are no hypoglycemic complications. Diabetic complications include heart disease. Risk factors for coronary artery disease include diabetes mellitus, dyslipidemia, hypertension and male sex. He is compliant with treatment most of the time. He is following a diabetic diet. He participates in exercise three times a week. Eye exam is current.  Hypertension This is a chronic problem. The current episode started more than 1 year ago. The problem has been gradually improving since onset. The problem is controlled. Pertinent negatives include no blurred vision or palpitations. Risk factors for coronary artery disease include diabetes mellitus, dyslipidemia, obesity and male gender. Past treatments include beta blockers. The current treatment provides moderate improvement. Hypertensive end-organ damage includes CAD/MI.     Past Medical History:  Diagnosis Date   CAD (coronary artery disease) 2010   CABG x 1  DR. OWEN   Hyperglycemia      Family History   Problem Relation Age of Onset   Hypertension Father    Diabetes Father    Hypertension Mother    Stroke Mother    Diabetes Mother    Alzheimer's disease Mother    Hypertension Brother    Diabetes Brother      Current Outpatient Medications:    aspirin EC 81 MG tablet, Take 1 tablet (81 mg total) by mouth daily., Disp: , Rfl:    atorvastatin (LIPITOR) 20 MG tablet, TAKE 1 TABLET BY MOUTH DAILY AT 6 PM., Disp: 90 tablet, Rfl: 2   Empagliflozin-metFORMIN HCl ER (SYNJARDY XR) 25-1000 MG TB24, TAKE 1 TABLET BY MOUTH EVERY DAY AT 6AM, Disp: 90 tablet, Rfl: 1   Multiple Vitamin (MULTIVITAMIN) tablet, Take 1 tablet by mouth daily., Disp: , Rfl:    ONETOUCH ULTRA test strip, CHECK BLOOD SUGAR DAILY, Disp: 100 strip, Rfl: 3   TRUEplus Lancets 30G MISC, USE AS DIRECTED TO CHECK  BLOOD SUGAR ONCE DAILY, Disp: 100 each, Rfl: 3   metoprolol tartrate (LOPRESSOR) 25 MG tablet, Take 1 tablet (25 mg total) by mouth 2 (two) times daily., Disp: 180 tablet, Rfl: 3   No Known Allergies   Review of Systems  Constitutional: Negative.   HENT: Negative.    Eyes:  Negative for blurred vision.  Cardiovascular: Negative.  Negative for palpitations.  Endocrine: Negative for polydipsia, polyphagia and polyuria.  Genitourinary: Negative.   Musculoskeletal: Negative.   Skin: Negative.   Allergic/Immunologic: Negative.   Neurological: Negative.   Hematological: Negative.      Today's Vitals   02/20/23 0829  BP: 106/78  Pulse: 77  Temp: 97.8 F (36.6 C)  SpO2: 98%  Weight: 201 lb 3.2 oz (91.3 kg)  Height: 5\' 10"  (1.778 m)   Body mass index is 28.87 kg/m.  Wt Readings from Last 3 Encounters:  02/20/23 201 lb 3.2 oz (91.3 kg)  11/25/22 203 lb 3.2 oz (92.2 kg)  10/17/22 202 lb 9.6 oz (91.9 kg)    Objective:  Physical Exam Vitals and nursing note reviewed.  Constitutional:      Appearance: Normal appearance.  HENT:     Head: Normocephalic and atraumatic.  Eyes:     Extraocular Movements:  Extraocular movements intact.  Cardiovascular:     Rate and Rhythm: Normal rate and regular rhythm.     Heart sounds: Normal heart sounds.  Pulmonary:     Effort: Pulmonary effort is normal.     Breath sounds: Normal breath sounds. No stridor.  Musculoskeletal:     Cervical back: Normal range of motion.  Skin:    General: Skin is warm.  Neurological:     General: No focal deficit present.     Mental Status: He is alert.  Psychiatric:        Mood and Affect: Mood normal.         Assessment And Plan:     1. Type 2 diabetes mellitus with hyperlipidemia Comments: Chronic, he will c/w Synjardy daily. He is encouraged to limit his intake of sugary foods/beverages.  LDL goal < 70. He will c/w atorvastatin 20mg  daily. - Hemoglobin A1c - Microalbumin / creatinine urine ratio - BMP8+EGFR  2. Benign hypertensive heart disease without heart failure Comments: Chronic, well controlled. He will c/w metoprolol 25mg  daily. He is encouraged to avoid adding salt to his foods.  3. Coronary artery disease involving coronary bypass graft of native heart without angina pectoris Comments: Chronic, he is s/p CABG. Currently on ASA, BBlocker and statin therapy.  4. Body mass index (BMI) 28.0-28.9, adult Comments: He is encouraged to aim for at least 150 minutes of exercise/week. His BMI is acceptable for his demographic.     Patient was given opportunity to ask questions. Patient verbalized understanding of the plan and was able to repeat key elements of the plan. All questions were answered to their satisfaction.   I, Gwynneth Aliment, MD, have reviewed all documentation for this visit. The documentation on 02/20/23 for the exam, diagnosis, procedures, and orders are all accurate and complete.   IF YOU HAVE BEEN REFERRED TO A SPECIALIST, IT MAY TAKE 1-2 WEEKS TO SCHEDULE/PROCESS THE REFERRAL. IF YOU HAVE NOT HEARD FROM US/SPECIALIST IN TWO WEEKS, PLEASE GIVE [place] A CALL AT [phone].   THE  PATIENT IS ENCOURAGED TO PRACTICE SOCIAL DISTANCING DUE TO THE COVID-19 PANDEMIC.

## 2023-02-20 NOTE — Patient Instructions (Signed)

## 2023-02-21 LAB — MICROALBUMIN / CREATININE URINE RATIO
Creatinine, Urine: 98.9 mg/dL
Microalb/Creat Ratio: 3 mg/g creat (ref 0–29)
Microalbumin, Urine: 3.1 ug/mL

## 2023-02-21 LAB — BMP8+EGFR
BUN/Creatinine Ratio: 15 (ref 10–24)
BUN: 17 mg/dL (ref 8–27)
CO2: 19 mmol/L — ABNORMAL LOW (ref 20–29)
Calcium: 9.6 mg/dL (ref 8.6–10.2)
Chloride: 103 mmol/L (ref 96–106)
Creatinine, Ser: 1.15 mg/dL (ref 0.76–1.27)
Glucose: 154 mg/dL — ABNORMAL HIGH (ref 70–99)
Potassium: 4.3 mmol/L (ref 3.5–5.2)
Sodium: 137 mmol/L (ref 134–144)
eGFR: 70 mL/min/{1.73_m2} (ref >59)

## 2023-02-21 LAB — HEMOGLOBIN A1C
Est. average glucose Bld gHb Est-mCnc: 166 mg/dL
Hgb A1c MFr Bld: 7.4 % — ABNORMAL HIGH (ref 4.8–5.6)

## 2023-03-13 ENCOUNTER — Encounter: Payer: 59 | Admitting: Internal Medicine

## 2023-04-17 ENCOUNTER — Encounter: Payer: 59 | Admitting: Internal Medicine

## 2023-04-20 ENCOUNTER — Other Ambulatory Visit: Payer: Self-pay | Admitting: Internal Medicine

## 2023-05-10 ENCOUNTER — Ambulatory Visit (INDEPENDENT_AMBULATORY_CARE_PROVIDER_SITE_OTHER): Payer: 59 | Admitting: Internal Medicine

## 2023-05-10 ENCOUNTER — Other Ambulatory Visit: Payer: 59

## 2023-05-10 ENCOUNTER — Encounter: Payer: Self-pay | Admitting: Internal Medicine

## 2023-05-10 VITALS — BP 118/70 | HR 91 | Temp 97.9°F | Ht 70.0 in | Wt 203.6 lb

## 2023-05-10 DIAGNOSIS — E785 Hyperlipidemia, unspecified: Secondary | ICD-10-CM

## 2023-05-10 DIAGNOSIS — Z0000 Encounter for general adult medical examination without abnormal findings: Secondary | ICD-10-CM

## 2023-05-10 DIAGNOSIS — E1169 Type 2 diabetes mellitus with other specified complication: Secondary | ICD-10-CM | POA: Diagnosis not present

## 2023-05-10 DIAGNOSIS — Z6829 Body mass index (BMI) 29.0-29.9, adult: Secondary | ICD-10-CM

## 2023-05-10 DIAGNOSIS — I25810 Atherosclerosis of coronary artery bypass graft(s) without angina pectoris: Secondary | ICD-10-CM

## 2023-05-10 DIAGNOSIS — I119 Hypertensive heart disease without heart failure: Secondary | ICD-10-CM | POA: Diagnosis not present

## 2023-05-10 DIAGNOSIS — I25708 Atherosclerosis of coronary artery bypass graft(s), unspecified, with other forms of angina pectoris: Secondary | ICD-10-CM

## 2023-05-10 DIAGNOSIS — E663 Overweight: Secondary | ICD-10-CM

## 2023-05-10 LAB — POCT URINALYSIS DIPSTICK
Bilirubin, UA: NEGATIVE
Blood, UA: NEGATIVE
Glucose, UA: POSITIVE — AB
Ketones, UA: NEGATIVE
Leukocytes, UA: NEGATIVE
Nitrite, UA: NEGATIVE
Protein, UA: NEGATIVE
Spec Grav, UA: 1.02 (ref 1.010–1.025)
Urobilinogen, UA: 0.2 E.U./dL
pH, UA: 5.5 (ref 5.0–8.0)

## 2023-05-10 LAB — HEMOCCULT GUIAC POC 1CARD (OFFICE): Fecal Occult Blood, POC: NEGATIVE

## 2023-05-10 NOTE — Progress Notes (Signed)
I,Victoria T Deloria Lair, CMA,acting as a Neurosurgeon for Gwynneth Aliment, MD.,have documented all relevant documentation on the behalf of Gwynneth Aliment, MD,as directed by  Gwynneth Aliment, MD while in the presence of Gwynneth Aliment, MD.  Subjective:   Patient ID: Darin Pitts , male    DOB: [birth year] , 67 y.o.   MRN: [id number]  Chief Complaint  Patient presents with   Annual Exam   Diabetes   Hypertension    HPI  He is here today for a full physical examination. He reports compliance with meds. He has no specific concerns or complaints at this time. He denies headaches, chest pain and shortness of breath.   He reports having DM eye exam scheduled for 07/24/2023.    Diabetes He presents for his follow-up diabetic visit. He has type 2 diabetes mellitus. His disease course has been improving. There are no hypoglycemic associated symptoms. There are no diabetic associated symptoms. Pertinent negatives for diabetes include no blurred vision. There are no hypoglycemic complications. Diabetic complications include heart disease. Risk factors for coronary artery disease include diabetes mellitus, dyslipidemia, hypertension and male sex. He is compliant with treatment most of the time. He is following a diabetic diet. He participates in exercise three times a week. Eye exam is current.  Hypertension This is a chronic problem. The current episode started more than 1 year ago. The problem has been gradually improving since onset. The problem is controlled. Pertinent negatives include no blurred vision or palpitations. Risk factors for coronary artery disease include diabetes mellitus, dyslipidemia, obesity and male gender. Past treatments include beta blockers. The current treatment provides moderate improvement. Hypertensive end-organ damage includes CAD/MI.     Past Medical History:  Diagnosis Date   CAD (coronary artery disease) 2010   CABG x 1  DR. OWEN   Hyperglycemia      Family History   Problem Relation Age of Onset   Hypertension Father    Diabetes Father    Hypertension Mother    Stroke Mother    Diabetes Mother    Alzheimer's disease Mother    Hypertension Brother    Diabetes Brother      Current Outpatient Medications:    aspirin EC 81 MG tablet, Take 1 tablet (81 mg total) by mouth daily., Disp: , Rfl:    Empagliflozin-metFORMIN HCl ER (SYNJARDY XR) 25-1000 MG TB24, TAKE 1 TABLET BY MOUTH EVERY DAY AT 6AM, Disp: 90 tablet, Rfl: 1   metoprolol tartrate (LOPRESSOR) 25 MG tablet, Take 1 tablet (25 mg total) by mouth 2 (two) times daily., Disp: 180 tablet, Rfl: 3   Multiple Vitamin (MULTIVITAMIN) tablet, Take 1 tablet by mouth daily., Disp: , Rfl:    ONETOUCH ULTRA test strip, CHECK BLOOD SUGAR DAILY, Disp: 100 strip, Rfl: 3   Semaglutide (RYBELSUS) 3 MG TABS, TAKE 1 TABLET DAILY., Disp: 30 tablet, Rfl: 1   TRUEplus Lancets 30G MISC, USE AS DIRECTED TO CHECK BLOOD  SUGAR ONCE DAILY, Disp: 100 each, Rfl: 3   atorvastatin (LIPITOR) 20 MG tablet, TAKE 1 TABLET BY MOUTH DAILY AT 6 PM., Disp: 90 tablet, Rfl: 2   No Known Allergies   Men's preventive visit. Patient Health Questionnaire (PHQ-2) is  Flowsheet Row Office Visit from 05/10/2023 in [facility]  PHQ-2 Total Score 0     . Patient is on a healthy, diabetic  diet. Marital status: Married. Relevant history for alcohol use is:  Social History   Substance  and Sexual Activity  Alcohol Use No  . Relevant history for tobacco use is:  Social History   Tobacco Use  Smoking Status Never  Smokeless Tobacco Never  .   Review of Systems  Constitutional: Negative.   HENT: Negative.    Eyes: Negative.  Negative for blurred vision.  Respiratory: Negative.    Cardiovascular: Negative.  Negative for palpitations.  Gastrointestinal: Negative.   Endocrine: Negative.   Genitourinary: Negative.   Musculoskeletal: Negative.   Skin: Negative.   Allergic/Immunologic: Negative.    Neurological: Negative.   Hematological: Negative.   Psychiatric/Behavioral: Negative.       Today's Vitals   05/10/23 1505  BP: 118/70  Pulse: 91  Temp: 97.9 F (36.6 C)  SpO2: 98%  Weight: 203 lb 9.6 oz (92.4 kg)  Height: 5\' 10"  (1.778 m)   Body mass index is 29.21 kg/m.  Wt Readings from Last 3 Encounters:  05/10/23 203 lb 9.6 oz (92.4 kg)  02/20/23 201 lb 3.2 oz (91.3 kg)  11/25/22 203 lb 3.2 oz (92.2 kg)    Objective:  Physical Exam Vitals and nursing note reviewed.  Constitutional:      Appearance: Normal appearance.  HENT:     Head: Normocephalic and atraumatic.     Right Ear: Tympanic membrane, ear canal and external ear normal.     Left Ear: Tympanic membrane, ear canal and external ear normal.     Nose: Nose normal.     Mouth/Throat:     Mouth: Mucous membranes are moist.     Pharynx: Oropharynx is clear.  Eyes:     Extraocular Movements: Extraocular movements intact.     Conjunctiva/sclera: Conjunctivae normal.     Pupils: Pupils are equal, round, and reactive to light.  Cardiovascular:     Rate and Rhythm: Normal rate and regular rhythm.     Pulses: Normal pulses.          Dorsalis pedis pulses are 2+ on the right side and 2+ on the left side.     Heart sounds: Normal heart sounds.  Pulmonary:     Effort: Pulmonary effort is normal.     Breath sounds: Normal breath sounds.  Chest:  Breasts:    Right: Normal. No swelling, bleeding, inverted nipple, mass or nipple discharge.     Left: Normal. No swelling, bleeding, inverted nipple, mass or nipple discharge.  Abdominal:     General: Abdomen is flat. Bowel sounds are normal.     Palpations: Abdomen is soft.  Genitourinary:    Prostate: Normal.     Rectum: Normal. Guaiac result negative.  Musculoskeletal:        General: Normal range of motion.     Cervical back: Normal range of motion and neck supple.  Feet:     Right foot:     Protective Sensation: 5 sites tested.  5 sites sensed.     Skin  integrity: Dry skin present.     Toenail Condition: Right toenails are long.     Left foot:     Protective Sensation: 5 sites tested.  5 sites sensed.     Skin integrity: Dry skin present.     Toenail Condition: Left toenails are long.  Skin:    General: Skin is warm.  Neurological:     General: No focal deficit present.     Mental Status: He is alert.  Psychiatric:        Mood and Affect: Mood normal.  Behavior: Behavior normal.         Assessment And Plan:    Encounter for general adult medical examination w/o abnormal findings Assessment & Plan: A full exam was performed, including DRE. Stool is heme negative. PATIENT IS ADVISED TO GET 30-45 MINUTES REGULAR EXERCISE NO LESS THAN FOUR TO FIVE DAYS PER WEEK - BOTH WEIGHTBEARING EXERCISES AND AEROBIC ARE RECOMMENDED.  PATIENT IS ADVISED TO FOLLOW A HEALTHY DIET WITH AT LEAST SIX FRUITS/VEGGIES PER DAY, DECREASE INTAKE OF RED MEAT, AND TO INCREASE FISH INTAKE TO TWO DAYS PER WEEK.  MEATS/FISH SHOULD NOT BE FRIED, BAKED OR BROILED IS PREFERABLE.  IT IS ALSO IMPORTANT TO CUT BACK ON YOUR SUGAR INTAKE. PLEASE AVOID ANYTHING WITH ADDED SUGAR, CORN SYRUP OR OTHER SWEETENERS. IF YOU MUST USE A SWEETENER, YOU CAN TRY STEVIA. IT IS ALSO IMPORTANT TO AVOID ARTIFICIALLY SWEETENERS AND DIET BEVERAGES. LASTLY, I SUGGEST WEARING SPF 50 SUNSCREEN ON EXPOSED PARTS AND ESPECIALLY WHEN IN THE DIRECT SUNLIGHT FOR AN EXTENDED PERIOD OF TIME.  PLEASE AVOID FAST FOOD RESTAURANTS AND INCREASE YOUR WATER INTAKE.   Orders: -     CMP14+EGFR -     Lipid panel -     Hemoglobin A1c -     CBC -     PSA Total (Reflex To Free) -     POCT occult blood stool  Type 2 diabetes mellitus with hyperlipidemia (HCC) Assessment & Plan: Chronic, LDL goal < 70.  He will c/w atorvastatin 20mg  daily. Diabetic foot exam was performed.  He will c/w Synjardy XR 25mg /1000mg  once daily, along with Rybelsus 3mg  daily. I DISCUSSED WITH THE PATIENT AT LENGTH REGARDING THE GOALS  OF GLYCEMIC CONTROL AND POSSIBLE LONG-TERM COMPLICATIONS.  I  ALSO STRESSED THE IMPORTANCE OF COMPLIANCE WITH HOME GLUCOSE MONITORING, DIETARY RESTRICTIONS INCLUDING AVOIDANCE OF SUGARY DRINKS/PROCESSED FOODS,  ALONG WITH REGULAR EXERCISE.  I  ALSO STRESSED THE IMPORTANCE OF ANNUAL EYE EXAMS, SELF FOOT CARE AND COMPLIANCE WITH OFFICE VISITS.   Orders: -     POCT urinalysis dipstick -     Microalbumin / creatinine urine ratio -     EKG 12-Lead  Benign hypertensive heart disease without heart failure Assessment & Plan: Chronic, well controlled. EKG performed, NSR w/o acute changes. He will c/2 metoprolol tartrate 25mg  twice weekly.  He is encouraged to follow a low sodium diet.   Orders: -     POCT urinalysis dipstick -     Microalbumin / creatinine urine ratio -     EKG 12-Lead  Coronary artery disease involving coronary bypass graft of native heart without angina pectoris Assessment & Plan: Chronic, he is currently without anginal symptoms. He will continue with ASA 81mg , metoprolol bid,    Overweight with body mass index (BMI) of 29 to 29.9 in adult Assessment & Plan: He is encouraged to aim for at least 150 minutes of exercise per week.       Return for 1 year HM, 4 month bpc & dm . Patient was given opportunity to ask questions. Patient verbalized understanding of the plan and was able to repeat key elements of the plan. All questions were answered to their satisfaction.    I, Gwynneth Aliment, MD, have reviewed all documentation for this visit. The documentation on 05/10/23 for the exam, diagnosis, procedures, and orders are all accurate and complete.

## 2023-05-10 NOTE — Patient Instructions (Signed)
Health Maintenance, Male Adopting a healthy lifestyle and getting preventive care are important in promoting health and wellness. Ask your health care provider about: The right schedule for you to have regular tests and exams. Things you can do on your own to prevent diseases and keep yourself healthy. What should I know about diet, weight, and exercise? Eat a healthy diet  Eat a diet that includes plenty of vegetables, fruits, low-fat dairy products, and lean protein. Do not eat a lot of foods that are high in solid fats, added sugars, or sodium. Maintain a healthy weight Body mass index (BMI) is a measurement that can be used to identify possible weight problems. It estimates body fat based on height and weight. Your health care provider can help determine your BMI and help you achieve or maintain a healthy weight. Get regular exercise Get regular exercise. This is one of the most important things you can do for your health. Most adults should: Exercise for at least 150 minutes each week. The exercise should increase your heart rate and make you sweat (moderate-intensity exercise). Do strengthening exercises at least twice a week. This is in addition to the moderate-intensity exercise. Spend less time sitting. Even light physical activity can be beneficial. Watch cholesterol and blood lipids Have your blood tested for lipids and cholesterol at 68 years of age, then have this test every 5 years. You may need to have your cholesterol levels checked more often if: Your lipid or cholesterol levels are high. You are older than 68 years of age. You are at high risk for heart disease. What should I know about cancer screening? Many types of cancers can be detected early and may often be prevented. Depending on your health history and family history, you may need to have cancer screening at various ages. This may include screening for: Colorectal cancer. Prostate cancer. Skin cancer. Lung  cancer. What should I know about heart disease, diabetes, and high blood pressure? Blood pressure and heart disease High blood pressure causes heart disease and increases the risk of stroke. This is more likely to develop in people who have high blood pressure readings or are overweight. Talk with your health care provider about your target blood pressure readings. Have your blood pressure checked: Every 3-5 years if you are 18-39 years of age. Every year if you are 40 years old or older. If you are between the ages of 65 and 75 and are a current or former smoker, ask your health care provider if you should have a one-time screening for abdominal aortic aneurysm (AAA). Diabetes Have regular diabetes screenings. This checks your fasting blood sugar level. Have the screening done: Once every three years after age 45 if you are at a normal weight and have a low risk for diabetes. More often and at a younger age if you are overweight or have a high risk for diabetes. What should I know about preventing infection? Hepatitis B If you have a higher risk for hepatitis B, you should be screened for this virus. Talk with your health care provider to find out if you are at risk for hepatitis B infection. Hepatitis C Blood testing is recommended for: Everyone born from 1945 through 1965. Anyone with known risk factors for hepatitis C. Sexually transmitted infections (STIs) You should be screened each year for STIs, including gonorrhea and chlamydia, if: You are sexually active and are younger than 68 years of age. You are older than 68 years of age and your   health care provider tells you that you are at risk for this type of infection. Your sexual activity has changed since you were last screened, and you are at increased risk for chlamydia or gonorrhea. Ask your health care provider if you are at risk. Ask your health care provider about whether you are at high risk for HIV. Your health care provider  may recommend a prescription medicine to help prevent HIV infection. If you choose to take medicine to prevent HIV, you should first get tested for HIV. You should then be tested every 3 months for as long as you are taking the medicine. Follow these instructions at home: Alcohol use Do not drink alcohol if your health care provider tells you not to drink. If you drink alcohol: Limit how much you have to 0-2 drinks a day. Know how much alcohol is in your drink. In the [place] one drink equals one 12 oz bottle of beer (355 mL), one 5 oz glass of wine (148 mL), or one 1 oz glass of hard liquor (44 mL). Lifestyle Do not use any products that contain nicotine or tobacco. These products include cigarettes, chewing tobacco, and vaping devices, such as e-cigarettes. If you need help quitting, ask your health care provider. Do not use street drugs. Do not share needles. Ask your health care provider for help if you need support or information about quitting drugs. General instructions Schedule regular health, dental, and eye exams. Stay current with your vaccines. Tell your health care provider if: You often feel depressed. You have ever been abused or do not feel safe at home. Summary Adopting a healthy lifestyle and getting preventive care are important in promoting health and wellness. Follow your health care provider's instructions about healthy diet, exercising, and getting tested or screened for diseases. Follow your health care provider's instructions on monitoring your cholesterol and blood pressure. This information is not intended to replace advice given to you by your health care provider. Make sure you discuss any questions you have with your health care provider. Document Revised: 03/15/2021 Document Reviewed: 03/15/2021 Elsevier Patient Education  2024 [employer]  

## 2023-05-12 LAB — CMP14+EGFR
ALT: 43 IU/L (ref 0–44)
AST: 30 IU/L (ref 0–40)
Albumin: 4.3 g/dL (ref 3.9–4.9)
Alkaline Phosphatase: 60 IU/L (ref 44–121)
BUN/Creatinine Ratio: 17 (ref 10–24)
BUN: 17 mg/dL (ref 8–27)
Bilirubin Total: 0.2 mg/dL (ref 0.0–1.2)
CO2: 19 mmol/L — ABNORMAL LOW (ref 20–29)
Calcium: 9.4 mg/dL (ref 8.6–10.2)
Chloride: 107 mmol/L — ABNORMAL HIGH (ref 96–106)
Creatinine, Ser: 0.99 mg/dL (ref 0.76–1.27)
Globulin, Total: 2.1 g/dL (ref 1.5–4.5)
Glucose: 134 mg/dL — ABNORMAL HIGH (ref 70–99)
Potassium: 4 mmol/L (ref 3.5–5.2)
Sodium: 139 mmol/L (ref 134–144)
Total Protein: 6.4 g/dL (ref 6.0–8.5)
eGFR: 83 mL/min/{1.73_m2} (ref >59)

## 2023-05-12 LAB — LIPID PANEL
Chol/HDL Ratio: 2.7 ratio (ref 0.0–5.0)
Cholesterol, Total: 116 mg/dL (ref 100–199)
HDL: 43 mg/dL (ref >39)
LDL Chol Calc (NIH): 43 mg/dL (ref 0–99)
Triglycerides: 187 mg/dL — ABNORMAL HIGH (ref 0–149)
VLDL Cholesterol Cal: 30 mg/dL (ref 5–40)

## 2023-05-12 LAB — MICROALBUMIN / CREATININE URINE RATIO
Creatinine, Urine: 62.4 mg/dL
Microalb/Creat Ratio: <5 mg/g creat (ref 0–29)
Microalbumin, Urine: <3 ug/mL

## 2023-05-12 LAB — PSA TOTAL (REFLEX TO FREE): Prostate Specific Ag, Serum: 0.4 ng/mL (ref 0.0–4.0)

## 2023-05-12 LAB — CBC
Hematocrit: 45.7 % (ref 37.5–51.0)
Hemoglobin: 15.1 g/dL (ref 13.0–17.7)
MCH: 29.6 pg (ref 26.6–33.0)
MCHC: 33 g/dL (ref 31.5–35.7)
MCV: 90 fL (ref 79–97)
Platelets: 187 10*3/uL (ref 150–450)
RBC: 5.1 x10E6/uL (ref 4.14–5.80)
RDW: 13.1 % (ref 11.6–15.4)
WBC: 6.9 10*3/uL (ref 3.4–10.8)

## 2023-05-12 LAB — HEMOGLOBIN A1C
Est. average glucose Bld gHb Est-mCnc: 154 mg/dL
Hgb A1c MFr Bld: 7 % — ABNORMAL HIGH (ref 4.8–5.6)

## 2023-05-15 DIAGNOSIS — Z6829 Body mass index (BMI) 29.0-29.9, adult: Secondary | ICD-10-CM | POA: Insufficient documentation

## 2023-05-15 DIAGNOSIS — Z6828 Body mass index (BMI) 28.0-28.9, adult: Secondary | ICD-10-CM | POA: Insufficient documentation

## 2023-05-15 DIAGNOSIS — E663 Overweight: Secondary | ICD-10-CM | POA: Insufficient documentation

## 2023-05-15 DIAGNOSIS — Z0000 Encounter for general adult medical examination without abnormal findings: Secondary | ICD-10-CM | POA: Insufficient documentation

## 2023-05-15 NOTE — Assessment & Plan Note (Signed)
Chronic, he is currently without anginal symptoms. He will continue with ASA 81mg , metoprolol bid,

## 2023-05-15 NOTE — Assessment & Plan Note (Signed)
He is encouraged to aim for at least 150 minutes of exercise per week.

## 2023-05-15 NOTE — Assessment & Plan Note (Signed)
A full exam was performed, including DRE. Stool is heme negative. PATIENT IS ADVISED TO GET 30-45 MINUTES REGULAR EXERCISE NO LESS THAN FOUR TO FIVE DAYS PER WEEK - BOTH WEIGHTBEARING EXERCISES AND AEROBIC ARE RECOMMENDED.  PATIENT IS ADVISED TO FOLLOW A HEALTHY DIET WITH AT LEAST SIX FRUITS/VEGGIES PER DAY, DECREASE INTAKE OF RED MEAT, AND TO INCREASE FISH INTAKE TO TWO DAYS PER WEEK.  MEATS/FISH SHOULD NOT BE FRIED, BAKED OR BROILED IS PREFERABLE.  IT IS ALSO IMPORTANT TO CUT BACK ON YOUR SUGAR INTAKE. PLEASE AVOID ANYTHING WITH ADDED SUGAR, CORN SYRUP OR OTHER SWEETENERS. IF YOU MUST USE A SWEETENER, YOU CAN TRY STEVIA. IT IS ALSO IMPORTANT TO AVOID ARTIFICIALLY SWEETENERS AND DIET BEVERAGES. LASTLY, I SUGGEST WEARING SPF 50 SUNSCREEN ON EXPOSED PARTS AND ESPECIALLY WHEN IN THE DIRECT SUNLIGHT FOR AN EXTENDED PERIOD OF TIME.  PLEASE AVOID FAST FOOD RESTAURANTS AND INCREASE YOUR WATER INTAKE.

## 2023-05-15 NOTE — Assessment & Plan Note (Signed)
Chronic, LDL goal < 70.  He will c/w atorvastatin 20mg  daily. Diabetic foot exam was performed.  He will c/w Synjardy XR 25mg /1000mg  once daily, along with Rybelsus 3mg  daily. I DISCUSSED WITH THE PATIENT AT LENGTH REGARDING THE GOALS OF GLYCEMIC CONTROL AND POSSIBLE LONG-TERM COMPLICATIONS.  I  ALSO STRESSED THE IMPORTANCE OF COMPLIANCE WITH HOME GLUCOSE MONITORING, DIETARY RESTRICTIONS INCLUDING AVOIDANCE OF SUGARY DRINKS/PROCESSED FOODS,  ALONG WITH REGULAR EXERCISE.  I  ALSO STRESSED THE IMPORTANCE OF ANNUAL EYE EXAMS, SELF FOOT CARE AND COMPLIANCE WITH OFFICE VISITS.

## 2023-05-15 NOTE — Assessment & Plan Note (Signed)
Chronic, well controlled. EKG performed, NSR w/o acute changes. He will c/2 metoprolol tartrate 25mg  twice weekly.  He is encouraged to follow a low sodium diet.

## 2023-05-18 ENCOUNTER — Other Ambulatory Visit: Payer: Self-pay

## 2023-05-18 DIAGNOSIS — E1169 Type 2 diabetes mellitus with other specified complication: Secondary | ICD-10-CM

## 2023-05-18 MED ORDER — RYBELSUS 3 MG PO TABS
ORAL_TABLET | ORAL | 1 refills | Status: DC
Start: 2023-05-18 — End: 2023-08-31

## 2023-05-23 ENCOUNTER — Other Ambulatory Visit: Payer: Self-pay | Admitting: Internal Medicine

## 2023-07-11 ENCOUNTER — Ambulatory Visit: Payer: 59 | Attending: Cardiovascular Disease

## 2023-07-11 DIAGNOSIS — Z024 Encounter for examination for driving license: Secondary | ICD-10-CM

## 2023-07-11 DIAGNOSIS — I25810 Atherosclerosis of coronary artery bypass graft(s) without angina pectoris: Secondary | ICD-10-CM | POA: Diagnosis not present

## 2023-07-11 DIAGNOSIS — E782 Mixed hyperlipidemia: Secondary | ICD-10-CM

## 2023-07-14 LAB — EXERCISE TOLERANCE TEST
Angina Index: 0
Duke Treadmill Score: 12
Estimated workload: 13.7
Exercise duration (min): 12 min
Exercise duration (sec): 5 s
MPHR: 153 {beats}/min
Peak HR: 171 {beats}/min
Percent HR: 111 %
RPE: 17
Rest HR: 85 {beats}/min
ST Depression (mm): 0 mm

## 2023-07-18 ENCOUNTER — Telehealth: Payer: Self-pay | Admitting: Cardiovascular Disease

## 2023-07-18 NOTE — Telephone Encounter (Signed)
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 He states that he can come by the office to pick it up. Please advise

## 2023-07-18 NOTE — Telephone Encounter (Signed)
Left detailed message per DPR letting pt know that I sent him a letter for DOT clearance in his MyChart. If he needs it printed for pickup, to call us back and we'd be glad to do that.

## 2023-07-24 LAB — HM DIABETES EYE EXAM

## 2023-08-30 ENCOUNTER — Other Ambulatory Visit: Payer: Self-pay | Admitting: Internal Medicine

## 2023-08-30 DIAGNOSIS — E1169 Type 2 diabetes mellitus with other specified complication: Secondary | ICD-10-CM

## 2023-09-14 ENCOUNTER — Ambulatory Visit: Payer: 59 | Admitting: Internal Medicine

## 2023-09-18 ENCOUNTER — Encounter: Payer: Self-pay | Admitting: Internal Medicine

## 2023-09-18 ENCOUNTER — Ambulatory Visit: Payer: 59 | Admitting: Internal Medicine

## 2023-09-18 VITALS — BP 122/80 | HR 77 | Temp 97.8°F | Ht 70.0 in | Wt 199.6 lb

## 2023-09-18 DIAGNOSIS — I119 Hypertensive heart disease without heart failure: Secondary | ICD-10-CM

## 2023-09-18 DIAGNOSIS — I25708 Atherosclerosis of coronary artery bypass graft(s), unspecified, with other forms of angina pectoris: Secondary | ICD-10-CM | POA: Diagnosis not present

## 2023-09-18 DIAGNOSIS — E785 Hyperlipidemia, unspecified: Secondary | ICD-10-CM | POA: Diagnosis not present

## 2023-09-18 DIAGNOSIS — E663 Overweight: Secondary | ICD-10-CM

## 2023-09-18 DIAGNOSIS — E1169 Type 2 diabetes mellitus with other specified complication: Secondary | ICD-10-CM

## 2023-09-18 DIAGNOSIS — Z6828 Body mass index (BMI) 28.0-28.9, adult: Secondary | ICD-10-CM

## 2023-09-18 MED ORDER — METOPROLOL TARTRATE 25 MG PO TABS: 25.0000 mg | ORAL_TABLET | Freq: Two times a day (BID) | ORAL | 3 refills | Status: DC

## 2023-09-18 MED ORDER — RYBELSUS 3 MG PO TABS: ORAL_TABLET | ORAL | 1 refills | Status: DC

## 2023-09-18 MED ORDER — SYNJARDY XR 25-1000 MG PO TB24: ORAL_TABLET | ORAL | 1 refills | Status: DC

## 2023-09-18 NOTE — Assessment & Plan Note (Signed)
Chronic, he is currently without anginal symptoms. He will continue with ASA 81mg , metoprolol bid and atorvastatin daily.

## 2023-09-18 NOTE — Patient Instructions (Signed)
Hypertension, Adult Hypertension is another name for high blood pressure. High blood pressure forces your heart to work harder to pump blood. This can cause problems over time. There are two numbers in a blood pressure reading. There is a top number (systolic) over a bottom number (diastolic). It is best to have a blood pressure that is below 120/80. What are the causes? The cause of this condition is not known. Some other conditions can lead to high blood pressure. What increases the risk? Some lifestyle factors can make you more likely to develop high blood pressure: Smoking. Not getting enough exercise or physical activity. Being overweight. Having too much fat, sugar, calories, or salt (sodium) in your diet. Drinking too much alcohol. Other risk factors include: Having any of these conditions: Heart disease. Diabetes. High cholesterol. Kidney disease. Obstructive sleep apnea. Having a family history of high blood pressure and high cholesterol. Age. The risk increases with age. Stress. What are the signs or symptoms? High blood pressure may not cause symptoms. Very high blood pressure (hypertensive crisis) may cause: Headache. Fast or uneven heartbeats (palpitations). Shortness of breath. Nosebleed. Vomiting or feeling like you may vomit (nauseous). Changes in how you see. Very bad chest pain. Feeling dizzy. Seizures. How is this treated? This condition is treated by making healthy lifestyle changes, such as: Eating healthy foods. Exercising more. Drinking less alcohol. Your doctor may prescribe medicine if lifestyle changes do not help enough and if: Your top number is above 130. Your bottom number is above 80. Your personal target blood pressure may vary. Follow these instructions at home: Eating and drinking  If told, follow the DASH eating plan. To follow this plan: Fill one half of your plate at each meal with fruits and vegetables. Fill one fourth of your plate  at each meal with whole grains. Whole grains include whole-wheat pasta, brown rice, and whole-grain bread. Eat or drink low-fat dairy products, such as skim milk or low-fat yogurt. Fill one fourth of your plate at each meal with low-fat (lean) proteins. Low-fat proteins include fish, chicken without skin, eggs, beans, and tofu. Avoid fatty meat, cured and processed meat, or chicken with skin. Avoid pre-made or processed food. Limit the amount of salt in your diet to less than 1,500 mg each day. Do not drink alcohol if: Your doctor tells you not to drink. You are pregnant, may be pregnant, or are planning to become pregnant. If you drink alcohol: Limit how much you have to: 0-1 drink a day for women. 0-2 drinks a day for men. Know how much alcohol is in your drink. In the [place] one drink equals one 12 oz bottle of beer (355 mL), one 5 oz glass of wine (148 mL), or one 1 oz glass of hard liquor (44 mL). Lifestyle  Work with your doctor to stay at a healthy weight or to lose weight. Ask your doctor what the best weight is for you. Get at least 30 minutes of exercise that causes your heart to beat faster (aerobic exercise) most days of the week. This may include walking, swimming, or biking. Get at least 30 minutes of exercise that strengthens your muscles (resistance exercise) at least 3 days a week. This may include lifting weights or doing Pilates. Do not smoke or use any products that contain nicotine or tobacco. If you need help quitting, ask your doctor. Check your blood pressure at home as told by your doctor. Keep all follow-up visits. Medicines Take over-the-counter and prescription medicines   only as told by your doctor. Follow directions carefully. Do not skip doses of blood pressure medicine. The medicine does not work as well if you skip doses. Skipping doses also puts you at risk for problems. Ask your doctor about side effects or reactions to medicines that you should watch  for. Contact a doctor if: You think you are having a reaction to the medicine you are taking. You have headaches that keep coming back. You feel dizzy. You have swelling in your ankles. You have trouble with your vision. Get help right away if: You get a very bad headache. You start to feel mixed up (confused). You feel weak or numb. You feel faint. You have very bad pain in your: Chest. Belly (abdomen). You vomit more than once. You have trouble breathing. These symptoms may be an emergency. Get help right away. Call 911. Do not wait to see if the symptoms will go away. Do not drive yourself to the hospital. Summary Hypertension is another name for high blood pressure. High blood pressure forces your heart to work harder to pump blood. For most people, a normal blood pressure is less than 120/80. Making healthy choices can help lower blood pressure. If your blood pressure does not get lower with healthy choices, you may need to take medicine. This information is not intended to replace advice given to you by your health care provider. Make sure you discuss any questions you have with your health care provider. Document Revised: 08/12/2021 Document Reviewed: 08/12/2021 Elsevier Patient Education  2024 Elsevier Inc.  

## 2023-09-18 NOTE — Assessment & Plan Note (Signed)
Chronic, well controlled.Marland Kitchen He will c/w metoprolol tartrate 25mg  twice daily.  He is encouraged to follow a low sodium diet.

## 2023-09-18 NOTE — Progress Notes (Signed)
I,Victoria T Deloria Lair, CMA,acting as a Neurosurgeon for Gwynneth Aliment, MD.,have documented all relevant documentation on the behalf of Gwynneth Aliment, MD,as directed by  Gwynneth Aliment, MD while in the presence of Gwynneth Aliment, MD.  Subjective:  Patient ID: Darin Pitts , male    DOB: 01/24/1956 , 67 y.o.   MRN: [id number]  Chief Complaint  Patient presents with   Diabetes   Hypertension    HPI  Pt here today for DM/HTN f/u.   He reports compliance with meds. He denies headaches, chest pain and shortness of breath. He reports his BS have been running from 89-160. He reports no specific questions or concerns.     Diabetes He presents for his follow-up diabetic visit. He has type 2 diabetes mellitus. His disease course has been improving. There are no hypoglycemic associated symptoms. There are no diabetic associated symptoms. Pertinent negatives for diabetes include no blurred vision, no polydipsia, no polyphagia and no polyuria. There are no hypoglycemic complications. Diabetic complications include heart disease. Risk factors for coronary artery disease include diabetes mellitus, dyslipidemia, hypertension and male sex. He is compliant with treatment most of the time. He is following a diabetic diet. He participates in exercise three times a week. Eye exam is current.  Hypertension This is a chronic problem. The current episode started more than 1 year ago. The problem has been gradually improving since onset. The problem is controlled. Pertinent negatives include no blurred vision or palpitations. Risk factors for coronary artery disease include diabetes mellitus, dyslipidemia, obesity and male gender. Past treatments include beta blockers. The current treatment provides moderate improvement. Hypertensive end-organ damage includes CAD/MI.     Past Medical History:  Diagnosis Date   CAD (coronary artery disease) 2010   CABG x 1  DR. OWEN   Hyperglycemia      Family History   Problem Relation Age of Onset   Hypertension Father    Diabetes Father    Hypertension Mother    Stroke Mother    Diabetes Mother    Alzheimer's disease Mother    Hypertension Brother    Diabetes Brother      Current Outpatient Medications:    aspirin EC 81 MG tablet, Take 1 tablet (81 mg total) by mouth daily., Disp: , Rfl:    atorvastatin (LIPITOR) 20 MG tablet, TAKE 1 TABLET BY MOUTH DAILY AT 6 PM., Disp: 90 tablet, Rfl: 2   Multiple Vitamin (MULTIVITAMIN) tablet, Take 1 tablet by mouth daily., Disp: , Rfl:    ONETOUCH ULTRA test strip, CHECK BLOOD SUGAR DAILY, Disp: 100 strip, Rfl: 3   TRUEplus Lancets 30G MISC, USE AS DIRECTED TO CHECK BLOOD  SUGAR ONCE DAILY, Disp: 100 each, Rfl: 3   Empagliflozin-metFORMIN HCl ER (SYNJARDY XR) 25-1000 MG TB24, TAKE 1 TABLET BY MOUTH EVERY DAY AT 6AM, Disp: 90 tablet, Rfl: 1   metoprolol tartrate (LOPRESSOR) 25 MG tablet, Take 1 tablet (25 mg total) by mouth 2 (two) times daily., Disp: 180 tablet, Rfl: 3   Semaglutide (RYBELSUS) 3 MG TABS, TAKE 1 TABLET DAILY., Disp: 90 tablet, Rfl: 1   No Known Allergies   Review of Systems  Constitutional: Negative.   HENT: Negative.    Eyes:  Negative for blurred vision.  Respiratory: Negative.    Cardiovascular: Negative.  Negative for palpitations.  Gastrointestinal: Negative.   Endocrine: Negative for polydipsia, polyphagia and polyuria.  Genitourinary: Negative.   Skin: Negative.   Allergic/Immunologic: Negative.   Neurological: Negative.  Hematological: Negative.      Today's Vitals   09/18/23 0833  BP: 122/80  Pulse: 77  Temp: 97.8 F (36.6 C)  SpO2: 98%  Weight: 199 lb 9.6 oz (90.5 kg)  Height: 5\' 10"  (1.778 m)   Body mass index is 28.64 kg/m.  Wt Readings from Last 3 Encounters:  09/18/23 199 lb 9.6 oz (90.5 kg)  05/10/23 203 lb 9.6 oz (92.4 kg)  02/20/23 201 lb 3.2 oz (91.3 kg)     Objective:  Physical Exam Vitals and nursing note reviewed.  Constitutional:       Appearance: Normal appearance.  HENT:     Head: Normocephalic and atraumatic.  Eyes:     Extraocular Movements: Extraocular movements intact.  Cardiovascular:     Rate and Rhythm: Normal rate and regular rhythm.     Heart sounds: Normal heart sounds.  Pulmonary:     Effort: Pulmonary effort is normal.     Breath sounds: Normal breath sounds.  Musculoskeletal:     Cervical back: Normal range of motion.  Skin:    General: Skin is warm.  Neurological:     General: No focal deficit present.     Mental Status: He is alert.  Psychiatric:        Mood and Affect: Mood normal.         Assessment And Plan:  Type 2 diabetes mellitus with hyperlipidemia (HCC) Assessment & Plan: Chronic, LDL goal < 70.  He will c/w atorvastatin 20mg  daily. Diabetic foot exam was performed.  He will c/w Synjardy XR 25mg /1000mg  once daily, along with Rybelsus 3mg  daily. He is encouraged to aim for at least 150 minutes of exercise per week. HE will rto in four months for re-evaluation.   Orders: -     CMP14+EGFR -     Hemoglobin A1c -     Rybelsus; TAKE 1 TABLET DAILY.  Dispense: 90 tablet; Refill: 1  Benign hypertensive heart disease without heart failure Assessment & Plan: Chronic, well controlled.Marland Kitchen He will c/w metoprolol tartrate 25mg  twice daily.  He is encouraged to follow a low sodium diet.   Orders: -     CMP14+EGFR  Coronary artery disease of bypass graft of native heart with stable angina pectoris [facility]) Assessment & Plan: Chronic, he is currently without anginal symptoms. He will continue with ASA 81mg , metoprolol bid and atorvastatin daily.   Overweight with body mass index (BMI) of 28 to 28.9 in adult Assessment & Plan: He is encouraged to aim for at least 150 minutes of exercise per week.    Other orders -     Synjardy XR; TAKE 1 TABLET BY MOUTH EVERY DAY AT 6AM  Dispense: 90 tablet; Refill: 1 -     Metoprolol Tartrate; Take 1 tablet (25 mg total) by mouth 2 (two) times daily.   Dispense: 180 tablet; Refill: 3     Return for 4 month bp & dm .  Patient was given opportunity to ask questions. Patient verbalized understanding of the plan and was able to repeat key elements of the plan. All questions were answered to their satisfaction.    I, Gwynneth Aliment, MD, have reviewed all documentation for this visit. The documentation on 09/18/23 for the exam, diagnosis, procedures, and orders are all accurate and complete.   IF YOU HAVE BEEN REFERRED TO A SPECIALIST, IT MAY TAKE 1-2 WEEKS TO SCHEDULE/PROCESS THE REFERRAL. IF YOU HAVE NOT HEARD FROM US/SPECIALIST IN TWO WEEKS, PLEASE GIVE [place] A CALL  AT [phone] X [phone].   THE PATIENT IS ENCOURAGED TO PRACTICE SOCIAL DISTANCING DUE TO THE COVID-19 PANDEMIC.

## 2023-09-18 NOTE — Assessment & Plan Note (Addendum)
Chronic, LDL goal < 70.  He will c/w atorvastatin 20mg  daily. Diabetic foot exam was performed.  He will c/w Synjardy XR 25mg /1000mg  once daily, along with Rybelsus 3mg  daily. He is encouraged to aim for at least 150 minutes of exercise per week. HE will rto in four months for re-evaluation.

## 2023-09-18 NOTE — Assessment & Plan Note (Signed)
He is encouraged to aim for at least 150 minutes of exercise per week.

## 2023-09-19 LAB — CMP14+EGFR
ALT: 35 [IU]/L (ref 0–44)
AST: 28 [IU]/L (ref 0–40)
Albumin: 4.2 g/dL (ref 3.9–4.9)
Alkaline Phosphatase: 52 [IU]/L (ref 44–121)
BUN/Creatinine Ratio: 12 (ref 10–24)
BUN: 12 mg/dL (ref 8–27)
Bilirubin Total: 0.5 mg/dL (ref 0.0–1.2)
CO2: 21 mmol/L (ref 20–29)
Calcium: 9.6 mg/dL (ref 8.6–10.2)
Chloride: 105 mmol/L (ref 96–106)
Creatinine, Ser: 1.02 mg/dL (ref 0.76–1.27)
Globulin, Total: 2.4 g/dL (ref 1.5–4.5)
Glucose: 115 mg/dL — ABNORMAL HIGH (ref 70–99)
Potassium: 4.4 mmol/L (ref 3.5–5.2)
Sodium: 143 mmol/L (ref 134–144)
Total Protein: 6.6 g/dL (ref 6.0–8.5)
eGFR: 81 mL/min/{1.73_m2} (ref >59)

## 2023-09-19 LAB — HEMOGLOBIN A1C
Est. average glucose Bld gHb Est-mCnc: 151 mg/dL
Hgb A1c MFr Bld: 6.9 % — ABNORMAL HIGH (ref 4.8–5.6)

## 2023-11-18 ENCOUNTER — Encounter: Payer: Self-pay | Admitting: Cardiovascular Disease

## 2023-11-18 NOTE — Progress Notes (Signed)
 Darin Pitts Date of Birth  12/16/1955       [facility] Office [address], [address] [place], [place]  [address] [phone]   Fax  [phone]    Problem List: 1. Coronary artery disease-status post CABG by Dr. Dusty in November, 2010 2. Diabetes Mellitus 3. Hyperlipidemia    Darin Pitts is a 68 y.o. gentleman with a history of coronary artery disease. He has not had any difficulty since his bypass grafting in 2010. He works as a naval architect and has been able to do all of his normal activities without any significant problems.  He tries to eat a low fat diet.  Nov. 3, 2014:  Darin Pitts has been seen by Katheryn since I last saw him.   No complaints.  He still works as a naval architect for [employer].    He is exercising regularly.   Nov. 9, 2015:  Darin Pitts is doing well.  No angina.  Working out regularly. No issues. Doing well  is fasting for lab work  Nov. 14, 2016:   Doing great . Works out regularly , no CP    Nov. 20, 2017:  Darin Pitts is seen for follow up visit . Working out regularly. No CP or dyspnea.   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   October 18, 2017:  Doing well .   Working out regularly .   October 22, 2018: Darin Pitts  is seen today for a follow-up visit. History of coronary artery disease and is status post coronary artery bypass grafting in 2010.  He has a history of hyperlipidemia. Works out regularly  Recent labs look great   October 08, 2019:  Darin Pitts is seen today for follow-up visit.  He has a history of CAD and status post CABG in 2010.  He has a history of hyperlipidemia. No cp or dyspnea  Works out regularly .    Drives for [employer]  He brought a note from Dr. Patrcia requesting exercise treadmill test and an updated ejection fraction which is apparently needed every 5 years.  He also is requesting a letter of cardiac clearance for [place].  Oct. 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  Still working out No angina , no limitations with working out  Nov. 7, 2022: Darin Pitts is seen for follow up of his coronary artery disease, CABG, hyperlipidemia. We recently did a CDL treadmill stress test during which he walked for 9 minutes.  He achieved a peak heart rate of 141 which is 90% predicted maximal heart rate.  He achieved 10.1 METS.  There were no ST or T wave changes.  The stress test was negative. No CP , no dyspnea . , BP is well controlled.   Jan. 19, 2024 Darin Pitts is seen for follow up of his CAD, CABG, HLD Had an ETT Sept. 27, 2023 .  He walked for 12 minutes,  max HR of 157.   13.7 mets .  No signs of ischemia   Work is going well  No CP , no dyspnea   His last hemoglobin A1c is 7.2.  I advised him to work on cutting down on his carbohydrates-foods that are white, wheat, sweet.  He will need a GXT in September 2024. Will check labs today including lipids, ALT, basic metabolic profile, LP(a)    Jan. 13, 2025  Darin Pitts is seen for follow up of his CAD, CABG,HLD   Lipids from July 2024 look great .  LDL is 43 Getting some exercise    Current Outpatient Medications on File Prior to Visit  Medication Sig Dispense Refill   aspirin  EC 81 MG tablet Take 1 tablet (81 mg total) by mouth daily.     atorvastatin  (LIPITOR) 20 MG tablet TAKE 1 TABLET BY MOUTH DAILY AT 6 PM. 90 tablet 2   Empagliflozin -metFORMIN  HCl ER (SYNJARDY  XR) 25-1000 MG TB24 TAKE 1 TABLET BY MOUTH EVERY DAY AT 6AM 90 tablet 1   metoprolol  tartrate (LOPRESSOR ) 25 MG tablet Take 1 tablet (25 mg total) by mouth 2 (two) times daily. 180 tablet 3   Multiple Vitamin (MULTIVITAMIN) tablet Take 1 tablet by mouth daily.     ONETOUCH ULTRA test strip CHECK BLOOD SUGAR DAILY 100 strip 3   Semaglutide  (RYBELSUS ) 3 MG TABS TAKE 1 TABLET DAILY. 90  tablet 1   TRUEplus Lancets 30G MISC USE AS DIRECTED TO CHECK BLOOD  SUGAR ONCE DAILY 100 each 3   No current facility-administered medications on file prior to visit.    No Known Allergies  Past Medical History:  Diagnosis Date   CAD (coronary artery disease) 2010   CABG x 1  DR. OWEN   Hyperglycemia     Past Surgical History:  Procedure Laterality Date   CORONARY ARTERY BYPASS GRAFT  2010   HEMORRHOID SURGERY  2002    Social History   Tobacco Use  Smoking Status Never  Smokeless Tobacco Never    Social History   Substance and Sexual Activity  Alcohol Use No    Family History  Problem Relation Age of Onset   Hypertension Father    Diabetes Father    Hypertension Mother    Stroke Mother    Diabetes Mother    Alzheimer's disease Mother    Hypertension Brother    Diabetes Brother     Reviw of Systems:  Reviewed in the HPI.  All other systems are negative.    Physical Exam: Blood pressure 116/80, pulse 83, height 5' 10 (1.778 m), weight 201 lb 6.4 oz (91.4 kg), SpO2 95%.      GEN:  Well nourished, well developed in no acute distress HEENT: Normal NECK: No JVD; No carotid bruits LYMPHATICS: No lymphadenopathy CARDIAC: RRR   RESPIRATORY:  Clear to auscultation without rales, wheezing or rhonchi  ABDOMEN: Soft, non-tender, non-distended MUSCULOSKELETAL:  No edema; No deformity  SKIN: Warm and dry NEUROLOGIC:  Alert and oriented x 3      ECG:           Assessment / Plan:    1. Coronary artery disease-    no  angina.  He works out regularly   . 2. Diabetes Mellitus -    follows with Dr. Jarold  HbA1C is 6.9   3. Hyperlipidemia-     Lipids are at goal  Chol = 116 HDL = 43 LDL = 43 Trigs = 812   Will have him see Dr. Kate in 1 year    Aleene Passe, MD  11/20/2023 8:51 AM    [facility] [address] [place],  [address] [place], [place]  [address] Pager [phone] Phone: [phone]; Fax: [phone]

## 2023-11-20 ENCOUNTER — Ambulatory Visit: Payer: 59 | Attending: Cardiovascular Disease | Admitting: Cardiovascular Disease

## 2023-11-20 ENCOUNTER — Encounter: Payer: Self-pay | Admitting: Cardiovascular Disease

## 2023-11-20 ENCOUNTER — Telehealth: Payer: Self-pay | Admitting: Cardiovascular Disease

## 2023-11-20 VITALS — BP 116/80 | HR 83 | Ht 70.0 in | Wt 201.4 lb

## 2023-11-20 DIAGNOSIS — I25708 Atherosclerosis of coronary artery bypass graft(s), unspecified, with other forms of angina pectoris: Secondary | ICD-10-CM | POA: Diagnosis not present

## 2023-11-20 DIAGNOSIS — E782 Mixed hyperlipidemia: Secondary | ICD-10-CM

## 2023-11-20 DIAGNOSIS — Z024 Encounter for examination for driving license: Secondary | ICD-10-CM

## 2023-11-20 NOTE — Telephone Encounter (Signed)
 Order placed for annual GXT stress test. Routed to MD to sign attestation. Directions sent to patient via MyChart.

## 2023-11-20 NOTE — Patient Instructions (Signed)
 Medication Instructions:  Your physician recommends that you continue on your current medications as directed. Please refer to the Current Medication list given to you today.  *If you need a refill on your cardiac medications before your next appointment, please call your pharmacy*  Lab Work: None ordered today.  Testing/Procedures: None ordered today.  Follow-Up: At [facility], you and your health needs are our priority.  As part of our continuing mission to provide you with exceptional heart care, we have created designated Provider Care Teams.  These Care Teams include your primary Cardiologist (physician) and Advanced Practice Providers (APPs -  Physician Assistants and Nurse Practitioners) who all work together to provide you with the care you need, when you need it.  Your next appointment:   1 year(s)  The format for your next appointment:   In Person  Provider:   Epifanio Lesches, MD

## 2023-11-20 NOTE — Telephone Encounter (Signed)
 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 He needs it by 07/22/24. He forgot to mention this to Dr Melburn Popper today at his appt

## 2024-01-23 ENCOUNTER — Ambulatory Visit: Payer: 59 | Admitting: Internal Medicine

## 2024-01-23 NOTE — Progress Notes (Deleted)
 I,Tarra Pence T Deloria Lair, CMA,acting as a Neurosurgeon for Gwynneth Aliment, MD.,have documented all relevant documentation on the behalf of Gwynneth Aliment, MD,as directed by  Gwynneth Aliment, MD while in the presence of Gwynneth Aliment, MD.  Subjective:  Patient ID: Darin Pitts , male    DOB: [birth year] , 68 y.o.   MRN: [id number]  No chief complaint on file.   HPI  Pt here today for DM/HTN f/u.   He reports compliance with meds. He denies headaches, chest pain and shortness of breath. He has no specific questions or concerns.     Diabetes He presents for his follow-up diabetic visit. He has type 2 diabetes mellitus. His disease course has been improving. There are no hypoglycemic associated symptoms. There are no diabetic associated symptoms. Pertinent negatives for diabetes include no blurred vision, no polydipsia, no polyphagia and no polyuria. There are no hypoglycemic complications. Diabetic complications include heart disease. Risk factors for coronary artery disease include diabetes mellitus, dyslipidemia, hypertension and male sex. He is compliant with treatment most of the time. He is following a diabetic diet. He participates in exercise three times a week. Eye exam is current.  Hypertension This is a chronic problem. The current episode started more than 1 year ago. The problem has been gradually improving since onset. The problem is controlled. Pertinent negatives include no blurred vision or palpitations. Risk factors for coronary artery disease include diabetes mellitus, dyslipidemia, obesity and male gender. Past treatments include beta blockers. The current treatment provides moderate improvement. Hypertensive end-organ damage includes CAD/MI.     Past Medical History:  Diagnosis Date   CAD (coronary artery disease) 2010   CABG x 1  DR. OWEN   Hyperglycemia      Family History  Problem Relation Age of Onset   Hypertension Father    Diabetes Father    Hypertension Mother     Stroke Mother    Diabetes Mother    Alzheimer's disease Mother    Hypertension Brother    Diabetes Brother      Current Outpatient Medications:    aspirin EC 81 MG tablet, Take 1 tablet (81 mg total) by mouth daily., Disp: , Rfl:    atorvastatin (LIPITOR) 20 MG tablet, TAKE 1 TABLET BY MOUTH DAILY AT 6 PM., Disp: 90 tablet, Rfl: 2   Empagliflozin-metFORMIN HCl ER (SYNJARDY XR) 25-1000 MG TB24, TAKE 1 TABLET BY MOUTH EVERY DAY AT 6AM, Disp: 90 tablet, Rfl: 1   metoprolol tartrate (LOPRESSOR) 25 MG tablet, Take 1 tablet (25 mg total) by mouth 2 (two) times daily., Disp: 180 tablet, Rfl: 3   Multiple Vitamin (MULTIVITAMIN) tablet, Take 1 tablet by mouth daily., Disp: , Rfl:    ONETOUCH ULTRA test strip, CHECK BLOOD SUGAR DAILY, Disp: 100 strip, Rfl: 3   Semaglutide (RYBELSUS) 3 MG TABS, TAKE 1 TABLET DAILY., Disp: 90 tablet, Rfl: 1   TRUEplus Lancets 30G MISC, USE AS DIRECTED TO CHECK BLOOD  SUGAR ONCE DAILY, Disp: 100 each, Rfl: 3   No Known Allergies   Review of Systems  Constitutional: Negative.   HENT: Negative.    Eyes:  Negative for blurred vision.  Cardiovascular: Negative.  Negative for palpitations.  Endocrine: Negative for polydipsia, polyphagia and polyuria.  Genitourinary: Negative.   Skin: Negative.   Allergic/Immunologic: Negative.   Neurological: Negative.   Hematological: Negative.      There were no vitals filed for this visit. There is no height or weight on file to  calculate BMI.  Wt Readings from Last 3 Encounters:  11/20/23 201 lb 6.4 oz (91.4 kg)  09/18/23 199 lb 9.6 oz (90.5 kg)  05/10/23 203 lb 9.6 oz (92.4 kg)     Objective:  Physical Exam      Assessment And Plan:  Type 2 diabetes mellitus with hyperlipidemia (HCC)  Benign hypertensive heart disease without heart failure  Coronary artery disease of bypass graft of native heart with stable angina pectoris (HCC)     No follow-ups on file.  Patient was given opportunity to ask questions.  Patient verbalized understanding of the plan and was able to repeat key elements of the plan. All questions were answered to their satisfaction.  Gwynneth Aliment, MD  I, Gwynneth Aliment, MD, have reviewed all documentation for this visit. The documentation on 01/23/24 for the exam, diagnosis, procedures, and orders are all accurate and complete.   IF YOU HAVE BEEN REFERRED TO A SPECIALIST, IT MAY TAKE 1-2 WEEKS TO SCHEDULE/PROCESS THE REFERRAL. IF YOU HAVE NOT HEARD FROM US/SPECIALIST IN TWO WEEKS, PLEASE GIVE [place] A CALL AT [phone].   THE PATIENT IS ENCOURAGED TO PRACTICE SOCIAL DISTANCING DUE TO THE COVID-19 PANDEMIC.

## 2024-02-13 ENCOUNTER — Other Ambulatory Visit: Payer: Self-pay | Admitting: Internal Medicine

## 2024-02-22 ENCOUNTER — Ambulatory Visit: Admitting: Internal Medicine

## 2024-02-22 ENCOUNTER — Encounter: Payer: Self-pay | Admitting: Internal Medicine

## 2024-02-22 VITALS — BP 110/80 | HR 80 | Temp 97.6°F | Ht 70.0 in | Wt 201.0 lb

## 2024-02-22 DIAGNOSIS — E785 Hyperlipidemia, unspecified: Secondary | ICD-10-CM

## 2024-02-22 DIAGNOSIS — I25708 Atherosclerosis of coronary artery bypass graft(s), unspecified, with other forms of angina pectoris: Secondary | ICD-10-CM | POA: Diagnosis not present

## 2024-02-22 DIAGNOSIS — I119 Hypertensive heart disease without heart failure: Secondary | ICD-10-CM

## 2024-02-22 DIAGNOSIS — N529 Male erectile dysfunction, unspecified: Secondary | ICD-10-CM

## 2024-02-22 DIAGNOSIS — E1169 Type 2 diabetes mellitus with other specified complication: Secondary | ICD-10-CM | POA: Diagnosis not present

## 2024-02-22 NOTE — Patient Instructions (Signed)

## 2024-02-22 NOTE — Progress Notes (Signed)
 I,Victoria T Basil Lim, CMA,acting as a Neurosurgeon for Smiley Dung, MD.,have documented all relevant documentation on the behalf of Smiley Dung, MD,as directed by  Smiley Dung, MD while in the presence of Smiley Dung, MD.  Subjective:  Patient ID: Darin Pitts , male    DOB: 09/29/1956 , 68 y.o.   MRN: [id number]  Chief Complaint  Patient presents with   Diabetes    Patient presents today for bp, dm & cholesterol follow up. She reports compliance with mediations. Denies headache, chest pain & sob.   Hypertension   Hyperlipidemia    HPI Discussed the use of AI scribe software for clinical note transcription with the patient, who gave verbal consent to proceed.  History of Present Illness Darin Pitts is a 68 year old male with diabetes who presents for a diabetes check and reports erectile dysfunction.  He has difficulty maintaining an erection, which he associates with his current medication, Synjardy . This issue has been persistent since starting the medication and significantly impacts his quality of life. He is currently taking Synjardy  25/1000 mg, Lipitor 20 mg daily, metoprolol  25 mg twice a day, semaglutide  3 mg, and a baby aspirin .  His blood sugar levels are generally well-controlled, with a reading of 103 mg/dL this morning, though he occasionally experiences spikes up to 185 mg/dL, which he attributes to dietary choices the night before.  He experiences nocturia, getting up once or twice at night to urinate. He mentions a recent change in his work schedule, which is now more regular and less demanding, potentially affecting his sleep and urinary patterns. No chest pain.   Diabetes He presents for his follow-up diabetic visit. He has type 2 diabetes mellitus. His disease course has been improving. There are no hypoglycemic associated symptoms. There are no diabetic associated symptoms. Pertinent negatives for diabetes include no blurred vision, no polydipsia,  no polyphagia and no polyuria. There are no hypoglycemic complications. Diabetic complications include heart disease. Risk factors for coronary artery disease include diabetes mellitus, dyslipidemia, hypertension and male sex. He is compliant with treatment most of the time. He is following a diabetic diet. He participates in exercise three times a week. Eye exam is current.  Hypertension This is a chronic problem. The current episode started more than 1 year ago. The problem has been gradually improving since onset. The problem is controlled. Pertinent negatives include no blurred vision or palpitations. Risk factors for coronary artery disease include diabetes mellitus, dyslipidemia, obesity and male gender. Past treatments include beta blockers. The current treatment provides moderate improvement. Hypertensive end-organ damage includes CAD/MI.     Past Medical History:  Diagnosis Date   CAD (coronary artery disease) 2010   CABG x 1  DR. OWEN   Hyperglycemia      Family History  Problem Relation Age of Onset   Hypertension Father    Diabetes Father    Hypertension Mother    Stroke Mother    Diabetes Mother    Alzheimer's disease Mother    Hypertension Brother    Diabetes Brother      Current Outpatient Medications:    aspirin  EC 81 MG tablet, Take 1 tablet (81 mg total) by mouth daily., Disp: , Rfl:    atorvastatin  (LIPITOR) 20 MG tablet, TAKE 1 TABLET BY MOUTH DAILY AT 6 PM., Disp: 90 tablet, Rfl: 2   Empagliflozin -metFORMIN  HCl ER (SYNJARDY  XR) 25-1000 MG TB24, TAKE 1 TABLET BY MOUTH EVERY DAY AT 6AM, Disp: 90 tablet, Rfl:  1   Lancets Ultra Thin 30G MISC, USE TO CHECK BLOOD SUGAR ONCE  DAILY AS DIRECTED, Disp: 100 each, Rfl: 3   metoprolol  tartrate (LOPRESSOR ) 25 MG tablet, Take 1 tablet (25 mg total) by mouth 2 (two) times daily., Disp: 180 tablet, Rfl: 3   Multiple Vitamin (MULTIVITAMIN) tablet, Take 1 tablet by mouth daily., Disp: , Rfl:    ONETOUCH ULTRA test strip, CHECK  BLOOD SUGAR DAILY, Disp: 100 strip, Rfl: 3   Semaglutide  (RYBELSUS ) 3 MG TABS, TAKE 1 TABLET DAILY., Disp: 90 tablet, Rfl: 1   No Known Allergies   Review of Systems  Constitutional: Negative.   HENT: Negative.    Eyes:  Negative for blurred vision.  Respiratory: Negative.    Cardiovascular: Negative.  Negative for palpitations.  Gastrointestinal: Negative.   Endocrine: Negative for polydipsia, polyphagia and polyuria.  Genitourinary: Negative.   Skin: Negative.   Allergic/Immunologic: Negative.   Hematological: Negative.      Today's Vitals   02/22/24 1118  BP: 110/80  Pulse: 80  Temp: 97.6 F (36.4 C)  SpO2: 98%  Weight: 201 lb (91.2 kg)  Height: 5\' 10"  (1.778 m)   Body mass index is 28.84 kg/m.  Wt Readings from Last 3 Encounters:  02/22/24 201 lb (91.2 kg)  11/20/23 201 lb 6.4 oz (91.4 kg)  09/18/23 199 lb 9.6 oz (90.5 kg)     Objective:  Physical Exam Vitals and nursing note reviewed.  Constitutional:      Appearance: Normal appearance.  HENT:     Head: Normocephalic and atraumatic.  Eyes:     Extraocular Movements: Extraocular movements intact.  Cardiovascular:     Rate and Rhythm: Normal rate and regular rhythm.     Heart sounds: Normal heart sounds.  Pulmonary:     Effort: Pulmonary effort is normal.     Breath sounds: Normal breath sounds.  Musculoskeletal:     Cervical back: Normal range of motion.  Skin:    General: Skin is warm.  Neurological:     General: No focal deficit present.     Mental Status: He is alert.  Psychiatric:        Mood and Affect: Mood normal.         Assessment And Plan:  Type 2 diabetes mellitus with hyperlipidemia (HCC) Assessment & Plan: Chronic, LDL goal < 70.  He will c/w atorvastatin  20mg  daily. He will c/w Synjardy  XR 25mg /1000mg  once daily, along with Rybelsus  3mg  daily. He is encouraged to aim for at least 150 minutes of exercise per week. He will rto in four months for re-evaluation.   Orders: -      CMP14+EGFR -     Hemoglobin A1c  Benign hypertensive heart disease without heart failure Assessment & Plan: Chronic, well controlled.Aaron Aas He will c/w metoprolol  tartrate 25mg  twice daily.  He is encouraged to follow a low sodium diet.    Coronary artery disease of bypass graft of native heart with stable angina pectoris [facility]) Assessment & Plan: Chronic, he is currently without anginal symptoms. He will continue with ASA 81mg , metoprolol  bid and atorvastatin  daily.   Erectile dysfunction, unspecified erectile dysfunction type Assessment & Plan: Difficulty maintaining erection, possibly due to diabetes or low testosterone . Awaiting lab results for further management. - Order testosterone  level and PSA test. - Consider urologist referral if testosterone  low. - Discuss treatment options post-lab results.  Orders: -     PSA Total (Reflex To Free) -     Testosterone ,Free and  Total -     TSH -     Luteinizing hormone    Return if symptoms worsen or fail to improve.  Patient was given opportunity to ask questions. Patient verbalized understanding of the plan and was able to repeat key elements of the plan. All questions were answered to their satisfaction.    I, Smiley Dung, MD, have reviewed all documentation for this visit. The documentation on 02/22/24 for the exam, diagnosis, procedures, and orders are all accurate and complete.   IF YOU HAVE BEEN REFERRED TO A SPECIALIST, IT MAY TAKE 1-2 WEEKS TO SCHEDULE/PROCESS THE REFERRAL. IF YOU HAVE NOT HEARD FROM US /SPECIALIST IN TWO WEEKS, PLEASE GIVE US  A CALL AT [phone].   THE PATIENT IS ENCOURAGED TO PRACTICE SOCIAL DISTANCING DUE TO THE COVID-19 PANDEMIC.

## 2024-02-24 ENCOUNTER — Other Ambulatory Visit: Payer: Self-pay | Admitting: Internal Medicine

## 2024-02-24 ENCOUNTER — Encounter: Payer: Self-pay | Admitting: Internal Medicine

## 2024-02-24 DIAGNOSIS — E291 Testicular hypofunction: Secondary | ICD-10-CM

## 2024-02-24 LAB — CMP14+EGFR
ALT: 34 IU/L (ref 0–44)
AST: 27 IU/L (ref 0–40)
Albumin: 4.6 g/dL (ref 3.9–4.9)
Alkaline Phosphatase: 49 IU/L (ref 44–121)
BUN/Creatinine Ratio: 14 (ref 10–24)
BUN: 14 mg/dL (ref 8–27)
Bilirubin Total: 0.5 mg/dL (ref 0.0–1.2)
CO2: 22 mmol/L (ref 20–29)
Calcium: 9.3 mg/dL (ref 8.6–10.2)
Chloride: 103 mmol/L (ref 96–106)
Creatinine, Ser: 0.99 mg/dL (ref 0.76–1.27)
Globulin, Total: 2 g/dL (ref 1.5–4.5)
Glucose: 122 mg/dL — ABNORMAL HIGH (ref 70–99)
Potassium: 4.7 mmol/L (ref 3.5–5.2)
Sodium: 139 mmol/L (ref 134–144)
Total Protein: 6.6 g/dL (ref 6.0–8.5)
eGFR: 83 mL/min/{1.73_m2} (ref >59)

## 2024-02-24 LAB — HEMOGLOBIN A1C
Est. average glucose Bld gHb Est-mCnc: 151 mg/dL
Hgb A1c MFr Bld: 6.9 % — ABNORMAL HIGH (ref 4.8–5.6)

## 2024-02-24 LAB — TESTOSTERONE,FREE AND TOTAL
Testosterone, Free: 6.8 pg/mL (ref 6.6–18.1)
Testosterone: 332 ng/dL (ref 264–916)

## 2024-02-24 LAB — PSA TOTAL (REFLEX TO FREE): Prostate Specific Ag, Serum: 0.4 ng/mL (ref 0.0–4.0)

## 2024-02-24 LAB — TSH: TSH: 0.82 u[IU]/mL (ref 0.450–4.500)

## 2024-02-24 LAB — LUTEINIZING HORMONE: LH: 4 m[IU]/mL (ref 1.7–8.6)

## 2024-02-27 NOTE — Assessment & Plan Note (Signed)
 Difficulty maintaining erection, possibly due to diabetes or low testosterone . Awaiting lab results for further management. - Order testosterone  level and PSA test. - Consider urologist referral if testosterone  low. - Discuss treatment options post-lab results.

## 2024-02-27 NOTE — Assessment & Plan Note (Signed)
 Chronic, well controlled.Marland Kitchen He will c/w metoprolol tartrate 25mg  twice daily.  He is encouraged to follow a low sodium diet.

## 2024-02-27 NOTE — Assessment & Plan Note (Signed)
 Chronic, LDL goal < 70.  He will c/w atorvastatin  20mg  daily. He will c/w Synjardy  XR 25mg /1000mg  once daily, along with Rybelsus  3mg  daily. He is encouraged to aim for at least 150 minutes of exercise per week. He will rto in four months for re-evaluation.

## 2024-02-27 NOTE — Assessment & Plan Note (Signed)
 Chronic, he is currently without anginal symptoms. He will continue with ASA 81mg , metoprolol bid and atorvastatin daily.

## 2024-02-28 NOTE — Telephone Encounter (Unsigned)
 Copied from CRM [id number]. Topic: Clinical - Request for Lab/Test Order >> Feb 28, 2024 10:56 AM Baldomero Bone wrote: Reason for CRM: Patient wants blood work done for testosterone  level. Callback number is [phone]

## 2024-03-04 ENCOUNTER — Other Ambulatory Visit

## 2024-03-04 DIAGNOSIS — N529 Male erectile dysfunction, unspecified: Secondary | ICD-10-CM

## 2024-03-04 NOTE — Progress Notes (Signed)
 I,Harla Mensch T Waleska Buttery, CMA,acting as a Neurosurgeon for [employer] documented all relevant documentation on the behalf of TIMA-NURSE,as directed by  [facility] while in the presence of TIMA-NURSE.  Subjective:  Patient ID: Darin Pitts , male    DOB: 25-May-1956 , 68 y.o.   MRN: [id number]  No chief complaint on file.   HPI  HPI   Past Medical History:  Diagnosis Date   CAD (coronary artery disease) 2010   CABG x 1  DR. OWEN   Hyperglycemia      Family History  Problem Relation Age of Onset   Hypertension Father    Diabetes Father    Hypertension Mother    Stroke Mother    Diabetes Mother    Alzheimer's disease Mother    Hypertension Brother    Diabetes Brother      Current Outpatient Medications:    aspirin  EC 81 MG tablet, Take 1 tablet (81 mg total) by mouth daily., Disp: , Rfl:    atorvastatin  (LIPITOR) 20 MG tablet, TAKE 1 TABLET BY MOUTH DAILY AT 6 PM., Disp: 90 tablet, Rfl: 2   Empagliflozin -metFORMIN  HCl ER (SYNJARDY  XR) 25-1000 MG TB24, TAKE 1 TABLET BY MOUTH EVERY DAY AT 6AM, Disp: 90 tablet, Rfl: 1   Lancets Ultra Thin 30G MISC, USE TO CHECK BLOOD SUGAR ONCE  DAILY AS DIRECTED, Disp: 100 each, Rfl: 3   metoprolol  tartrate (LOPRESSOR ) 25 MG tablet, Take 1 tablet (25 mg total) by mouth 2 (two) times daily., Disp: 180 tablet, Rfl: 3   Multiple Vitamin (MULTIVITAMIN) tablet, Take 1 tablet by mouth daily., Disp: , Rfl:    ONETOUCH ULTRA test strip, CHECK BLOOD SUGAR DAILY, Disp: 100 strip, Rfl: 3   Semaglutide  (RYBELSUS ) 3 MG TABS, TAKE 1 TABLET DAILY., Disp: 90 tablet, Rfl: 1   No Known Allergies   Review of Systems   There were no vitals filed for this visit. There is no height or weight on file to calculate BMI.  Wt Readings from Last 3 Encounters:  02/22/24 201 lb (91.2 kg)  11/20/23 201 lb 6.4 oz (91.4 kg)  09/18/23 199 lb 9.6 oz (90.5 kg)     Objective:  Physical Exam      Assessment And Plan:  There are no diagnoses linked to this  encounter.   No follow-ups on file.  Patient was given opportunity to ask questions. Patient verbalized understanding of the plan and was able to repeat key elements of the plan. All questions were answered to their satisfaction.  TIMA-NURSE  I, TIMA-NURSE, have reviewed all documentation for this visit. The documentation on 03/04/24 for the exam, diagnosis, procedures, and orders are all accurate and complete.   IF YOU HAVE BEEN REFERRED TO A SPECIALIST, IT MAY TAKE 1-2 WEEKS TO SCHEDULE/PROCESS THE REFERRAL. IF YOU HAVE NOT HEARD FROM US /SPECIALIST IN TWO WEEKS, PLEASE GIVE US  A CALL AT [phone].   THE PATIENT IS ENCOURAGED TO PRACTICE SOCIAL DISTANCING DUE TO THE COVID-19 PANDEMIC.

## 2024-03-06 LAB — TESTOSTERONE,FREE AND TOTAL
Testosterone, Free: 10.9 pg/mL (ref 6.6–18.1)
Testosterone: 311 ng/dL (ref 264–916)

## 2024-03-07 ENCOUNTER — Encounter: Payer: Self-pay | Admitting: Internal Medicine

## 2024-04-01 ENCOUNTER — Other Ambulatory Visit: Payer: Self-pay | Admitting: Internal Medicine

## 2024-05-01 ENCOUNTER — Other Ambulatory Visit: Payer: Self-pay | Admitting: Internal Medicine

## 2024-05-16 ENCOUNTER — Encounter: Payer: 59 | Admitting: Internal Medicine

## 2024-05-21 ENCOUNTER — Ambulatory Visit
Admission: RE | Admit: 2024-05-21 | Discharge: 2024-05-21 | Disposition: A | Discharge status: Home / Self Care | Source: Ambulatory Visit | Attending: Family Medicine | Admitting: Family Medicine

## 2024-05-21 VITALS — BP 100/65 | HR 78 | Temp 98.1°F | Resp 18 | Ht 70.0 in | Wt 200.0 lb

## 2024-05-21 DIAGNOSIS — L03113 Cellulitis of right upper limb: Secondary | ICD-10-CM

## 2024-05-21 DIAGNOSIS — Z9189 Other specified personal risk factors, not elsewhere classified: Secondary | ICD-10-CM

## 2024-05-21 MED ORDER — DOXYCYCLINE HYCLATE 100 MG PO CAPS: 100.0000 mg | ORAL_CAPSULE | Freq: Two times a day (BID) | ORAL | 0 refills | Status: DC

## 2024-05-21 NOTE — ED Triage Notes (Signed)
 I have a previous tick bite on the back of my right shoulder, possibly gotten about a month ago, and slowly getting better, itching.

## 2024-05-21 NOTE — Discharge Instructions (Signed)
 Take doxycycline  100 mg --1 capsule 2 times daily for 10 days  Our staff will notify you if anything is positive on the blood work to check for Lyme disease.  If it is negative they will not call, but all your test results will also go to MyChart.

## 2024-05-21 NOTE — ED Provider Notes (Signed)
 [name]    CSN: [id number] Arrival date & time: 05/21/24  0950      History   Chief Complaint Chief Complaint  Patient presents with   Insect Bite    HPI Darin Pitts is a 68 y.o. male.   HPI Here for a small area that is swollen and itching on his right posterior shoulder.  He pulled the tick off that area about 1 month ago and it just continued to itch and is little sore.  His wife did look at it and a little bit of stuff came out of it.  No fever and no rash and no joint pains.  No trouble breathing.  NKDA  Past medical history significant for diabetes and his sugars have been good. Past Medical History:  Diagnosis Date   CAD (coronary artery disease) 2010   CABG x 1  DR. OWEN   Hyperglycemia     Patient Active Problem List   Diagnosis Date Noted   Erectile dysfunction 02/22/2024   Overweight with body mass index (BMI) of 28 to 28.9 in adult 05/15/2023   Encounter for general adult medical examination w/o abnormal findings 05/15/2023   Benign hypertensive heart disease without heart failure 04/22/2019   Glenohumeral arthritis, left 01/04/2019   CAD (coronary artery disease) 04/15/2018   Type 2 diabetes mellitus without complication, without long-term current use of insulin (HCC) 04/15/2018   Agitation 04/15/2018   Anxiety 04/15/2018   Essential hypertension 04/15/2018   Hyperlipidemia 09/21/2015    Past Surgical History:  Procedure Laterality Date   CORONARY ARTERY BYPASS GRAFT  2010   HEMORRHOID SURGERY  2002       Home Medications    Prior to Admission medications   Medication Sig Start Date End Date Taking? Authorizing Provider  aspirin  EC 81 MG tablet Take 1 tablet (81 mg total) by mouth daily. 09/15/14  Yes Nahser, Aleene PARAS, MD  atorvastatin  (LIPITOR) 20 MG tablet TAKE 1 TABLET BY MOUTH DAILY AT 6 PM. 04/02/24  Yes Jarold Medici, MD  doxycycline  (VIBRAMYCIN ) 100 MG capsule Take 1 capsule (100 mg total) by mouth 2 (two) times  daily. 05/21/24  Yes Vonna Sharlet POUR, MD  metoprolol  tartrate (LOPRESSOR ) 25 MG tablet Take 1 tablet (25 mg total) by mouth 2 (two) times daily. 09/18/23  Yes Jarold Medici, MD  Multiple Vitamin (MULTIVITAMIN) tablet Take 1 tablet by mouth daily.   Yes [provider]  Semaglutide  (RYBELSUS ) 3 MG TABS TAKE 1 TABLET DAILY. 09/18/23  Yes Jarold Medici, MD  SYNJARDY  XR 25-1000 MG TB24 TAKE 1 TABLET BY MOUTH EVERY DAY AT 6AM 05/01/24  Yes Jarold Medici, MD  tadalafil (CIALIS) 5 MG tablet Take 5 mg by mouth daily. 04/23/24  Yes [provider]  Lancets Ultra Thin 30G MISC USE TO CHECK BLOOD SUGAR ONCE  DAILY AS DIRECTED 02/13/24   Jarold Medici, MD  [facility] ULTRA test strip CHECK BLOOD SUGAR DAILY 02/13/24   Jarold Medici, MD    Family History Family History  Problem Relation Age of Onset   Hypertension Father    Diabetes Father    Hypertension Mother    Stroke Mother    Diabetes Mother    Alzheimer's disease Mother    Hypertension Brother    Diabetes Brother     Social History Social History   Tobacco Use   Smoking status: Never   Smokeless tobacco: Never  Vaping Use   Vaping status: Never Used  Substance Use Topics  Alcohol use: No   Drug use: No     Allergies   Patient has no known allergies.   Review of Systems Review of Systems   Physical Exam Triage Vital Signs ED Triage Vitals  Encounter Vitals Group     BP 05/21/24 0959 100/65     Girls Systolic BP Percentile --      Girls Diastolic BP Percentile --      Boys Systolic BP Percentile --      Boys Diastolic BP Percentile --      Pulse Rate 05/21/24 0959 78     Resp 05/21/24 0959 18     Temp 05/21/24 0959 98.1 F (36.7 C)     Temp Source 05/21/24 0959 Oral     SpO2 05/21/24 0959 97 %     Weight 05/21/24 0956 200 lb (90.7 kg)     Height 05/21/24 0956 5' 10 (1.778 m)     Head Circumference --      Peak Flow --      Pain Score 05/21/24 0954 0     Pain Loc --      Pain Education --       Exclude from Growth Chart --    No data found.  Updated Vital Signs BP 100/65 (BP Location: Left Arm)   Pulse 78   Temp 98.1 F (36.7 C) (Oral)   Resp 18   Ht 5' 10 (1.778 m)   Wt 90.7 kg   SpO2 97%   BMI 28.70 kg/m   Visual Acuity Right Eye Distance:   Left Eye Distance:   Bilateral Distance:    Right Eye Near:   Left Eye Near:    Bilateral Near:     Physical Exam Vitals reviewed.  Constitutional:      General: He is not in acute distress.    Appearance: He is not ill-appearing, toxic-appearing or diaphoretic.  HENT:     Mouth/Throat:     Mouth: Mucous membranes are moist.  Cardiovascular:     Rate and Rhythm: Normal rate and regular rhythm.     Heart sounds: No murmur heard. Pulmonary:     Effort: Pulmonary effort is normal.     Breath sounds: Normal breath sounds.  Skin:    Coloration: Skin is not jaundiced or pale.     Findings: No rash.     Comments: There is an area of induration and erythema and mild tenderness about 2 cm in diameter.  This is on the posterior right lateral shoulder.  There is a punctate area that drains a tiny amount of white purulent material on palpation of the indurated area.  There is no target rash.  Neurological:     General: No focal deficit present.     Mental Status: He is alert and oriented to person, place, and time.  Psychiatric:        Behavior: Behavior normal.      UC Treatments / Results  Labs (all labs ordered are listed, but only abnormal results are displayed) Labs Reviewed  LYME DISEASE SEROLOGY W/REFLEX    EKG   Radiology No results found.  Procedures Procedures (including critical care time)  Medications Ordered in UC Medications - No data to display  Initial Impression / Assessment and Plan / UC Course  I have reviewed the triage vital signs and the nursing notes.  Pertinent labs & imaging results that were available during my care of the patient were reviewed by me and considered  in my  medical decision making (see chart for details).     Doxycycline  is sent in for cellulitis/draining abscess of the right shoulder, and also for potential exposure to Lyme disease.  Since this started with a tick bite and it has not been healing, Lyme titers are drawn today and we will notify him if those are positive.  It will not change our treatment though. Final Clinical Impressions(s) / UC Diagnoses   Final diagnoses:  Cellulitis of right shoulder  Risk of exposure to Lyme disease     Discharge Instructions      Take doxycycline  100 mg --1 capsule 2 times daily for 10 days  Our staff will notify you if anything is positive on the blood work to check for Lyme disease.  If it is negative they will not call, but all your test results will also go to MyChart.      ED Prescriptions     Medication Sig Dispense Auth. Provider   doxycycline  (VIBRAMYCIN ) 100 MG capsule Take 1 capsule (100 mg total) by mouth 2 (two) times daily. 20 capsule Keera Altidor K, MD      PDMP not reviewed this encounter.   Vonna Sharlet POUR, MD 05/21/24 1017

## 2024-05-22 LAB — LYME DISEASE SEROLOGY W/REFLEX: Lyme Total Antibody EIA: NEGATIVE

## 2024-05-27 ENCOUNTER — Encounter: Payer: Self-pay | Admitting: Internal Medicine

## 2024-05-27 ENCOUNTER — Ambulatory Visit: Payer: Self-pay | Admitting: Internal Medicine

## 2024-05-27 ENCOUNTER — Ambulatory Visit: Payer: 59 | Admitting: Internal Medicine

## 2024-05-27 VITALS — BP 110/82 | HR 81 | Temp 97.9°F | Ht 70.0 in | Wt 200.8 lb

## 2024-05-27 DIAGNOSIS — Z0000 Encounter for general adult medical examination without abnormal findings: Secondary | ICD-10-CM

## 2024-05-27 DIAGNOSIS — I119 Hypertensive heart disease without heart failure: Secondary | ICD-10-CM

## 2024-05-27 DIAGNOSIS — I25708 Atherosclerosis of coronary artery bypass graft(s), unspecified, with other forms of angina pectoris: Secondary | ICD-10-CM

## 2024-05-27 DIAGNOSIS — W57XXXA Bitten or stung by nonvenomous insect and other nonvenomous arthropods, initial encounter: Secondary | ICD-10-CM | POA: Insufficient documentation

## 2024-05-27 DIAGNOSIS — E1169 Type 2 diabetes mellitus with other specified complication: Secondary | ICD-10-CM

## 2024-05-27 DIAGNOSIS — N5201 Erectile dysfunction due to arterial insufficiency: Secondary | ICD-10-CM

## 2024-05-27 DIAGNOSIS — E785 Hyperlipidemia, unspecified: Secondary | ICD-10-CM

## 2024-05-27 DIAGNOSIS — E663 Overweight: Secondary | ICD-10-CM

## 2024-05-27 DIAGNOSIS — W57XXXD Bitten or stung by nonvenomous insect and other nonvenomous arthropods, subsequent encounter: Secondary | ICD-10-CM

## 2024-05-27 DIAGNOSIS — S20461D Insect bite (nonvenomous) of right back wall of thorax, subsequent encounter: Secondary | ICD-10-CM

## 2024-05-27 DIAGNOSIS — Z6828 Body mass index (BMI) 28.0-28.9, adult: Secondary | ICD-10-CM

## 2024-05-27 LAB — HEMOCCULT GUIAC POC 1CARD (OFFICE): Fecal Occult Blood, POC: NEGATIVE

## 2024-05-27 LAB — POCT URINALYSIS DIP (CLINITEK)
Bilirubin, UA: NEGATIVE
Blood, UA: NEGATIVE
Glucose, UA: 500 mg/dL — AB
Ketones, POC UA: NEGATIVE mg/dL
Leukocytes, UA: NEGATIVE
Nitrite, UA: NEGATIVE
POC PROTEIN,UA: NEGATIVE
Spec Grav, UA: 1.02 (ref 1.010–1.025)
Urobilinogen, UA: 0.2 U/dL
pH, UA: 5.5 (ref 5.0–8.0)

## 2024-05-27 MED ORDER — RYBELSUS 3 MG PO TABS: ORAL_TABLET | ORAL | 1 refills | Status: AC

## 2024-05-27 NOTE — Patient Instructions (Addendum)
 [place]  Health Maintenance, Male Adopting a healthy lifestyle and getting preventive care are important in promoting health and wellness. Ask your health care provider about: The right schedule for you to have regular tests and exams. Things you can do on your own to prevent diseases and keep yourself healthy. What should I know about diet, weight, and exercise? Eat a healthy diet  Eat a diet that includes plenty of vegetables, fruits, low-fat dairy products, and lean protein. Do not eat a lot of foods that are high in solid fats, added sugars, or sodium. Maintain a healthy weight Body mass index (BMI) is a measurement that can be used to identify possible weight problems. It estimates body fat based on height and weight. Your health care provider can help determine your BMI and help you achieve or maintain a healthy weight. Get regular exercise Get regular exercise. This is one of the most important things you can do for your health. Most adults should: Exercise for at least 150 minutes each week. The exercise should increase your heart rate and make you sweat (moderate-intensity exercise). Do strengthening exercises at least twice a week. This is in addition to the moderate-intensity exercise. Spend less time sitting. Even light physical activity can be beneficial. Watch cholesterol and blood lipids Have your blood tested for lipids and cholesterol at 69 years of age, then have this test every 5 years. You may need to have your cholesterol levels checked more often if: Your lipid or cholesterol levels are high. You are older than 69 years of age. You are at high risk for heart disease. What should I know about cancer screening? Many types of cancers can be detected early and may often be prevented. Depending on your health history and family history, you may need to have cancer screening at various ages. This may include screening for: Colorectal cancer. Prostate cancer. Skin  cancer. Lung cancer. What should I know about heart disease, diabetes, and high blood pressure? Blood pressure and heart disease High blood pressure causes heart disease and increases the risk of stroke. This is more likely to develop in people who have high blood pressure readings or are overweight. Talk with your health care provider about your target blood pressure readings. Have your blood pressure checked: Every 3-5 years if you are 68-108 years of age. Every year if you are 32 years old or older. If you are between the ages of 54 and 64 and are a current or former smoker, ask your health care provider if you should have a one-time screening for abdominal aortic aneurysm (AAA). Diabetes Have regular diabetes screenings. This checks your fasting blood sugar level. Have the screening done: Once every three years after age 24 if you are at a normal weight and have a low risk for diabetes. More often and at a younger age if you are overweight or have a high risk for diabetes. What should I know about preventing infection? Hepatitis B If you have a higher risk for hepatitis B, you should be screened for this virus. Talk with your health care provider to find out if you are at risk for hepatitis B infection. Hepatitis C Blood testing is recommended for: Everyone born from [birth year] through 1966. Anyone with known risk factors for hepatitis C. Sexually transmitted infections (STIs) You should be screened each year for STIs, including gonorrhea and chlamydia, if: You are sexually active and are younger than 69 years of age. You are older than 69 years of age  and your health care provider tells you that you are at risk for this type of infection. Your sexual activity has changed since you were last screened, and you are at increased risk for chlamydia or gonorrhea. Ask your health care provider if you are at risk. Ask your health care provider about whether you are at high risk for HIV. Your health  care provider may recommend a prescription medicine to help prevent HIV infection. If you choose to take medicine to prevent HIV, you should first get tested for HIV. You should then be tested every 3 months for as long as you are taking the medicine. Follow these instructions at home: Alcohol use Do not drink alcohol if your health care provider tells you not to drink. If you drink alcohol: Limit how much you have to 0-2 drinks a day. Know how much alcohol is in your drink. In the [place] one drink equals one 12 oz bottle of beer (355 mL), one 5 oz glass of wine (148 mL), or one 1 oz glass of hard liquor (44 mL). Lifestyle Do not use any products that contain nicotine or tobacco. These products include cigarettes, chewing tobacco, and vaping devices, such as e-cigarettes. If you need help quitting, ask your health care provider. Do not use street drugs. Do not share needles. Ask your health care provider for help if you need support or information about quitting drugs. General instructions Schedule regular health, dental, and eye exams. Stay current with your vaccines. Tell your health care provider if: You often feel depressed. You have ever been abused or do not feel safe at home. Summary Adopting a healthy lifestyle and getting preventive care are important in promoting health and wellness. Follow your health care provider's instructions about healthy diet, exercising, and getting tested or screened for diseases. Follow your health care provider's instructions on monitoring your cholesterol and blood pressure. This information is not intended to replace advice given to you by your health care provider. Make sure you discuss any questions you have with your health care provider. Document Revised: 03/15/2021 Document Reviewed: 03/15/2021 Elsevier Patient Education  2024 [employer].

## 2024-05-27 NOTE — Assessment & Plan Note (Addendum)
 Chronic, most recent Urology consultation was reviewed.  - continue with tadalafil as prescribed - May benefit from TRT, defer treatment to Urology

## 2024-05-27 NOTE — Assessment & Plan Note (Signed)
He is encouraged to aim for at least 150 minutes of exercise per week.

## 2024-05-27 NOTE — Assessment & Plan Note (Signed)
 Chronic, diabetic foot exam was performed. Blood glucose levels range 88-197 mg/dL. Discussed impact of sugary and diet beverages on diabetes management. - Switch to Stevia or Truvia. - Avoid artificial sweeteners - Reduce Kool-Aid, replace with water and a splash of cranberry juice. - Continue with Rybelsus  and Synjardy  - I DISCUSSED WITH THE PATIENT AT LENGTH REGARDING THE GOALS OF GLYCEMIC CONTROL AND POSSIBLE LONG-TERM COMPLICATIONS.  I  ALSO STRESSED THE IMPORTANCE OF COMPLIANCE WITH HOME GLUCOSE MONITORING, DIETARY RESTRICTIONS INCLUDING AVOIDANCE OF SUGARY DRINKS/PROCESSED FOODS,  ALONG WITH REGULAR EXERCISE.  I  ALSO STRESSED THE IMPORTANCE OF ANNUAL EYE EXAMS, SELF FOOT CARE AND COMPLIANCE WITH OFFICE VISITS.

## 2024-05-27 NOTE — Assessment & Plan Note (Addendum)
 Chronic, he has a remote h/o of CABG in 2010.  He is currently without anginal symptoms. He will continue with ASA 81mg , metoprolol  bid and atorvastatin  daily. - Cardiology input is appreciated.

## 2024-05-27 NOTE — Assessment & Plan Note (Signed)
 A full exam was performed.  DRE performed, stool is heme negative.  He is advised to get 30-45 minutes of regular exercise, no less than four to five days per week. Both weight-bearing and aerobic exercises are recommended.  He is advised to follow a healthy diet with at least six fruits/veggies per day, decrease intake of red meat and other saturated fats and to increase fish intake to twice weekly.  Meats/fish should not be fried -- baked, boiled or broiled is preferable. It is also important to cut back on your sugar intake.  Be sure to read labels - try to avoid anything with added sugar, high fructose corn syrup or other sweeteners.  If you must use a sweetener, you can try stevia or monkfruit.  It is also important to avoid artificially sweetened foods/beverages and diet drinks. Lastly, wear SPF 50 sunscreen on exposed skin and when in direct sunlight for an extended period of time.  Be sure to avoid fast food restaurants and aim for at least 60 ounces of water daily.

## 2024-05-27 NOTE — Progress Notes (Signed)
 I,Victoria T Emmitt, CMA,acting as a Neurosurgeon for Catheryn LOISE Slocumb, MD.,have documented all relevant documentation on the behalf of Catheryn LOISE Slocumb, MD,as directed by  Catheryn LOISE Slocumb, MD while in the presence of Catheryn LOISE Slocumb, MD.  Subjective:   Patient ID: Darin Pitts , male    DOB: [birth year] , 68 y.o.   MRN: [id number]  Chief Complaint  Patient presents with   Annual Exam    Patient presents today for annual exam. He reports compliance with medications. Denies headache, chest pain & sob. He has no specific questions or concerns.    Diabetes   Hypertension    HPI Discussed the use of AI scribe software for clinical note transcription with the patient, who gave verbal consent to proceed.  History of Present Illness Darin Pitts is a 68 year old male who presents for an annual physical exam and diabetes follow up.   He experienced a tick bite about a month ago while cutting under trees. His wife removed the tick, and he sought medical attention three weeks later due to persistent itching. He was prescribed doxycycline , which he is still taking.  His diabetes management includes blood sugar levels ranging from 88 to 197 mg/dL. He drinks Kool-Aid daily, sweetened with Splenda, and sometimes eats before checking his blood sugar levels. He is currently taking Synjardy  and Rybelsus  for diabetes management. He recently had an issue with his Rybelsus  prescription, receiving only a 30-day supply instead of the usual 90-day supply.  He denies any issues with urination and reports no calf pain during physical activities such as mowing the lawn. He exercises about three days a week and has reduced his mileage.  He is on multiple medications, including aspirin , atorvastatin  20 mg, metoprolol , and Synjardy . He mentions occasional blood in his stools, which he attributes to hemorrhoids, and denies any straining during bowel movements. No trouble with urination or calf pain during physical  activities. Regular dental exams.   Diabetes He presents for his follow-up diabetic visit. He has type 2 diabetes mellitus. His disease course has been improving. There are no hypoglycemic associated symptoms. There are no diabetic associated symptoms. Pertinent negatives for diabetes include no blurred vision, no polydipsia, no polyphagia and no polyuria. There are no hypoglycemic complications. Diabetic complications include heart disease. Risk factors for coronary artery disease include diabetes mellitus, dyslipidemia, hypertension and male sex. He is compliant with treatment most of the time. He is following a diabetic diet. He participates in exercise three times a week. Eye exam is current.  Hypertension This is a chronic problem. The current episode started more than 1 year ago. The problem has been gradually improving since onset. The problem is controlled. Pertinent negatives include no blurred vision or palpitations. Risk factors for coronary artery disease include diabetes mellitus, dyslipidemia, obesity and male gender. Past treatments include beta blockers. The current treatment provides moderate improvement. Hypertensive end-organ damage includes CAD/MI.     Past Medical History:  Diagnosis Date   CAD (coronary artery disease) 2010   CABG x 1  DR. OWEN   Hyperglycemia      Family History  Problem Relation Age of Onset   Hypertension Father    Diabetes Father    Hypertension Mother    Stroke Mother    Diabetes Mother    Alzheimer's disease Mother    Hypertension Brother    Diabetes Brother      Current Outpatient Medications:    aspirin  EC 81 MG tablet,  Take 1 tablet (81 mg total) by mouth daily., Disp: , Rfl:    atorvastatin  (LIPITOR) 20 MG tablet, TAKE 1 TABLET BY MOUTH DAILY AT 6 PM., Disp: 90 tablet, Rfl: 2   doxycycline  (VIBRAMYCIN ) 100 MG capsule, Take 1 capsule (100 mg total) by mouth 2 (two) times daily., Disp: 20 capsule, Rfl: 0   Lancets Ultra Thin 30G MISC,  USE TO CHECK BLOOD SUGAR ONCE  DAILY AS DIRECTED, Disp: 100 each, Rfl: 3   metoprolol  tartrate (LOPRESSOR ) 25 MG tablet, Take 1 tablet (25 mg total) by mouth 2 (two) times daily., Disp: 180 tablet, Rfl: 3   Multiple Vitamin (MULTIVITAMIN) tablet, Take 1 tablet by mouth daily., Disp: , Rfl:    ONETOUCH ULTRA test strip, CHECK BLOOD SUGAR DAILY, Disp: 100 strip, Rfl: 3   Semaglutide  (RYBELSUS ) 3 MG TABS, TAKE 1 TABLET DAILY., Disp: 90 tablet, Rfl: 1   SYNJARDY  XR 25-1000 MG TB24, TAKE 1 TABLET BY MOUTH EVERY DAY AT 6AM, Disp: 90 tablet, Rfl: 1   tadalafil (CIALIS) 5 MG tablet, Take 5 mg by mouth daily., Disp: , Rfl:    No Known Allergies   Men's preventive visit. Patient Health Questionnaire (PHQ-2) is  Flowsheet Row Office Visit from 05/27/2024 in [facility]  PHQ-2 Total Score 0  . Patient is on a diabetic, heart healthy diet. Marital status: Married. Relevant history for alcohol use is:  Social History   Substance and Sexual Activity  Alcohol Use No  . Relevant history for tobacco use is:  Social History   Tobacco Use  Smoking Status Never  Smokeless Tobacco Never  .   Review of Systems  Constitutional: Negative.   HENT: Negative.    Eyes: Negative.  Negative for blurred vision.  Respiratory: Negative.    Cardiovascular: Negative.  Negative for palpitations.  Gastrointestinal: Negative.   Endocrine: Negative.  Negative for polydipsia, polyphagia and polyuria.  Genitourinary: Negative.   Skin: Negative.   Allergic/Immunologic: Negative.   Neurological: Negative.   Hematological: Negative.   Psychiatric/Behavioral: Negative.       Today's Vitals   05/27/24 0851  BP: 110/82  Pulse: 81  Temp: 97.9 F (36.6 C)  SpO2: 98%  Weight: 200 lb 12.8 oz (91.1 kg)  Height: 5' 10 (1.778 m)   Body mass index is 28.81 kg/m.  Wt Readings from Last 3 Encounters:  05/27/24 200 lb 12.8 oz (91.1 kg)  05/21/24 200 lb (90.7 kg)  02/22/24 201 lb  (91.2 kg)    Objective:  Physical Exam Vitals and nursing note reviewed.  Constitutional:      Appearance: Normal appearance.  HENT:     Head: Normocephalic and atraumatic.     Right Ear: Tympanic membrane, ear canal and external ear normal.     Left Ear: Tympanic membrane, ear canal and external ear normal.     Nose: Nose normal.     Mouth/Throat:     Mouth: Mucous membranes are moist.     Pharynx: Oropharynx is clear.  Eyes:     Extraocular Movements: Extraocular movements intact.     Conjunctiva/sclera: Conjunctivae normal.     Pupils: Pupils are equal, round, and reactive to light.  Cardiovascular:     Rate and Rhythm: Normal rate and regular rhythm.     Pulses: Normal pulses.          Dorsalis pedis pulses are 2+ on the right side and 2+ on the left side.     Heart  sounds: Normal heart sounds.  Pulmonary:     Effort: Pulmonary effort is normal.     Breath sounds: Normal breath sounds.  Chest:  Breasts:    Right: Normal. No swelling, bleeding, inverted nipple, mass or nipple discharge.     Left: Normal. No swelling, bleeding, inverted nipple, mass or nipple discharge.  Abdominal:     General: Abdomen is flat. Bowel sounds are normal.     Palpations: Abdomen is soft.  Genitourinary:    Prostate: Normal.     Rectum: Normal. Guaiac result negative.  Musculoskeletal:        General: Normal range of motion.     Cervical back: Normal range of motion and neck supple.  Feet:     Right foot:     Protective Sensation: 5 sites tested.  5 sites sensed.     Skin integrity: Dry skin present.     Toenail Condition: Right toenails are long.     Left foot:     Protective Sensation: 5 sites tested.  5 sites sensed.     Skin integrity: Dry skin present.     Toenail Condition: Left toenails are long.  Skin:    General: Skin is warm.  Neurological:     General: No focal deficit present.     Mental Status: He is alert.  Psychiatric:        Mood and Affect: Mood normal.         Behavior: Behavior normal.         Assessment And Plan:    Encounter for general adult medical examination w/o abnormal findings Assessment & Plan: A full exam was performed.  DRE performed, stool is heme negative.  He is advised to get 30-45 minutes of regular exercise, no less than four to five days per week. Both weight-bearing and aerobic exercises are recommended.  He is advised to follow a healthy diet with at least six fruits/veggies per day, decrease intake of red meat and other saturated fats and to increase fish intake to twice weekly.  Meats/fish should not be fried -- baked, boiled or broiled is preferable. It is also important to cut back on your sugar intake.  Be sure to read labels - try to avoid anything with added sugar, high fructose corn syrup or other sweeteners.  If you must use a sweetener, you can try stevia or monkfruit.  It is also important to avoid artificially sweetened foods/beverages and diet drinks. Lastly, wear SPF 50 sunscreen on exposed skin and when in direct sunlight for an extended period of time.  Be sure to avoid fast food restaurants and aim for at least 60 ounces of water daily.      Orders: -     CMP14+EGFR -     Lipid panel -     Hemoglobin A1c -     CBC -     POCT occult blood stool  Type 2 diabetes mellitus with hyperlipidemia (HCC) Assessment & Plan: Chronic, diabetic foot exam was performed. Blood glucose levels range 88-197 mg/dL. Discussed impact of sugary and diet beverages on diabetes management. - Switch to Stevia or Truvia. - Avoid artificial sweeteners - Reduce Kool-Aid, replace with water and a splash of cranberry juice. - Continue with Rybelsus  and Synjardy  - I DISCUSSED WITH THE PATIENT AT LENGTH REGARDING THE GOALS OF GLYCEMIC CONTROL AND POSSIBLE LONG-TERM COMPLICATIONS.  I  ALSO STRESSED THE IMPORTANCE OF COMPLIANCE WITH HOME GLUCOSE MONITORING, DIETARY RESTRICTIONS INCLUDING AVOIDANCE OF SUGARY DRINKS/PROCESSED  FOODS,  ALONG WITH  REGULAR EXERCISE.  I  ALSO STRESSED THE IMPORTANCE OF ANNUAL EYE EXAMS, SELF FOOT CARE AND COMPLIANCE WITH OFFICE VISITS.   Orders: -     POCT URINALYSIS DIP (CLINITEK) -     Microalbumin / creatinine urine ratio -     EKG 12-Lead -     Rybelsus ; TAKE 1 TABLET DAILY.  Dispense: 90 tablet; Refill: 1  Benign hypertensive heart disease without heart failure Assessment & Plan: Chronic, well controlled.. EKG performed, NSR w/ first degree AV block and RAE. He will c/w metoprolol  tartrate 25mg  twice daily.  He is encouraged to follow a low sodium diet.  - We also discussed possibly adding an ARB in the future   Coronary artery disease of bypass graft of native heart with stable angina pectoris [facility]) Assessment & Plan: Chronic, he has a remote h/o of CABG in 2010.  He is currently without anginal symptoms. He will continue with ASA 81mg , metoprolol  bid and atorvastatin  daily. - Cardiology input is appreciated.   Erectile dysfunction due to arterial insufficiency Assessment & Plan: Chronic, most recent Urology consultation was reviewed.  - continue with tadalafil as prescribed - May benefit from TRT, defer treatment to Urology   Tick bite of right back wall of thorax, subsequent encounter Assessment & Plan: Recent tick bite treated with doxycycline . No infection signs. - Complete doxycycline  course.   Overweight with body mass index (BMI) of 28 to 28.9 in adult Assessment & Plan: He is encouraged to aim for at least 150 minutes of exercise per week.     Return for 1 year HM, 4 month dm f/u. Patient was given opportunity to ask questions. Patient verbalized understanding of the plan and was able to repeat key elements of the plan. All questions were answered to their satisfaction.   I, Catheryn LOISE Slocumb, MD, have reviewed all documentation for this visit. The documentation on 05/27/24 for the exam, diagnosis, procedures, and orders are all accurate and complete.

## 2024-05-27 NOTE — Assessment & Plan Note (Signed)
 Chronic, well controlled.. EKG performed, NSR w/ first degree AV block and RAE. He will c/w metoprolol  tartrate 25mg  twice daily.  He is encouraged to follow a low sodium diet.  - We also discussed possibly adding an ARB in the future

## 2024-05-27 NOTE — Assessment & Plan Note (Signed)
 Recent tick bite treated with doxycycline . No infection signs. - Complete doxycycline  course.

## 2024-05-28 LAB — CBC
Hematocrit: 50.1 % (ref 37.5–51.0)
Hemoglobin: 15.7 g/dL (ref 13.0–17.7)
MCH: 29.1 pg (ref 26.6–33.0)
MCHC: 31.3 g/dL — ABNORMAL LOW (ref 31.5–35.7)
MCV: 93 fL (ref 79–97)
Platelets: 191 x10E3/uL (ref 150–450)
RBC: 5.39 x10E6/uL (ref 4.14–5.80)
RDW: 13.2 % (ref 11.6–15.4)
WBC: 6.3 x10E3/uL (ref 3.4–10.8)

## 2024-05-28 LAB — LIPID PANEL
Chol/HDL Ratio: 2.5 ratio (ref 0.0–5.0)
Cholesterol, Total: 129 mg/dL (ref 100–199)
HDL: 52 mg/dL (ref >39)
LDL Chol Calc (NIH): 63 mg/dL (ref 0–99)
Triglycerides: 65 mg/dL (ref 0–149)
VLDL Cholesterol Cal: 14 mg/dL (ref 5–40)

## 2024-05-28 LAB — CMP14+EGFR
ALT: 34 IU/L (ref 0–44)
AST: 24 IU/L (ref 0–40)
Albumin: 4.5 g/dL (ref 3.9–4.9)
Alkaline Phosphatase: 50 IU/L (ref 44–121)
BUN/Creatinine Ratio: 13 (ref 10–24)
BUN: 13 mg/dL (ref 8–27)
Bilirubin Total: 0.4 mg/dL (ref 0.0–1.2)
CO2: 17 mmol/L — ABNORMAL LOW (ref 20–29)
Calcium: 9.2 mg/dL (ref 8.6–10.2)
Chloride: 105 mmol/L (ref 96–106)
Creatinine, Ser: 1 mg/dL (ref 0.76–1.27)
Globulin, Total: 2.3 g/dL (ref 1.5–4.5)
Glucose: 110 mg/dL — ABNORMAL HIGH (ref 70–99)
Potassium: 4.2 mmol/L (ref 3.5–5.2)
Sodium: 138 mmol/L (ref 134–144)
Total Protein: 6.8 g/dL (ref 6.0–8.5)
eGFR: 82 mL/min/1.73 (ref >59)

## 2024-05-28 LAB — MICROALBUMIN / CREATININE URINE RATIO
Creatinine, Urine: 94.3 mg/dL
Microalb/Creat Ratio: 5 mg/g{creat} (ref 0–29)
Microalbumin, Urine: 4.8 ug/mL

## 2024-05-28 LAB — HEMOGLOBIN A1C
Est. average glucose Bld gHb Est-mCnc: 151 mg/dL
Hgb A1c MFr Bld: 6.9 % — ABNORMAL HIGH (ref 4.8–5.6)

## 2024-05-31 ENCOUNTER — Ambulatory Visit: Payer: Self-pay

## 2024-07-10 ENCOUNTER — Encounter (HOSPITAL_COMMUNITY)

## 2024-07-12 ENCOUNTER — Encounter (HOSPITAL_COMMUNITY)

## 2024-07-16 ENCOUNTER — Encounter (HOSPITAL_COMMUNITY): Payer: Self-pay

## 2024-07-23 ENCOUNTER — Ambulatory Visit (HOSPITAL_COMMUNITY)
Admission: RE | Admit: 2024-07-23 | Discharge: 2024-07-23 | Disposition: A | Discharge status: Home / Self Care | Source: Ambulatory Visit | Attending: Cardiovascular Disease | Admitting: Cardiovascular Disease

## 2024-07-23 DIAGNOSIS — I25708 Atherosclerosis of coronary artery bypass graft(s), unspecified, with other forms of angina pectoris: Secondary | ICD-10-CM | POA: Diagnosis not present

## 2024-07-23 DIAGNOSIS — Z024 Encounter for examination for driving license: Secondary | ICD-10-CM | POA: Diagnosis not present

## 2024-07-23 LAB — EXERCISE TOLERANCE TEST
Angina Index: 0
Base ST Depression (mm): 0 mm
Duke Treadmill Score: 10
Estimated workload: 12
Exercise duration (min): 10 min
Exercise duration (sec): 10 s
MPHR: 152 {beats}/min
Peak HR: 148 {beats}/min
Percent HR: 97 %
Rest HR: 78 {beats}/min
ST Depression (mm): 0 mm

## 2024-07-24 ENCOUNTER — Telehealth: Payer: Self-pay | Admitting: Cardiology

## 2024-07-24 ENCOUNTER — Encounter: Payer: Self-pay | Admitting: *Deleted

## 2024-07-24 ENCOUNTER — Ambulatory Visit: Payer: Self-pay | Admitting: Cardiology

## 2024-07-24 NOTE — Telephone Encounter (Signed)
 Completed ETT yesterday, normal by MD notes  Routed to Dr. Kate and Frankey RN for letter request

## 2024-07-24 NOTE — Telephone Encounter (Signed)
 I've not seen this patient before, can we add him on 10/1?  I can't fill out paperwork until Ive seen him

## 2024-07-24 NOTE — Telephone Encounter (Signed)
 Called and unable to reach patient. Sent mychart that provider is out of office and will not be back in office until next week. Made patient aware that form will be given to provider once he returns back to clinic.

## 2024-07-24 NOTE — Telephone Encounter (Signed)
 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 Please advise

## 2024-08-06 NOTE — Progress Notes (Unsigned)
 Cardiology Office Note:    Date:  08/06/2024   ID:  Darin Pitts, DOB 02-08-1956, MRN [id number]  PCP:  Jarold Medici, MD  Cardiologist:  Aleene Passe, MD (Inactive)  Electrophysiologist:  None   Referring MD: Jarold Medici, MD   No chief complaint on file. ***  History of Present Illness:    Darin Pitts is a 68 y.o. male with a hx of CAD status post CABG, T2DM, hyperlipidemia, who presents for follow-up.  Previously followed with Dr. Passe.  Underwent cath in 2010 which showed severe ostial LAD disease with anatomy and favorable for PCI.  Underwent CABG with LIMA-LAD on 10/08/09.  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  ETT 07/2024 showed excellent exercise capacity (12.0 METS), no evidence of ischemia.  Past Medical History:  Diagnosis Date   CAD (coronary artery disease) 2010   CABG x 1  DR. OWEN   Hyperglycemia     Past Surgical History:  Procedure Laterality Date   CORONARY ARTERY BYPASS GRAFT  2010   HEMORRHOID SURGERY  2002    Current Medications: No outpatient medications have been marked as taking for the 08/07/24 encounter (Appointment) with Kate Lonni CROME, MD.     Allergies:   Patient has no known allergies.   Social History   Socioeconomic History   Marital status: Married    Spouse name: Not on file   Number of children: Not on file   Years of education: Not on file   Highest education level: Not on file  Occupational History   Not on file  Tobacco Use   Smoking status: Never   Smokeless tobacco: Never  Vaping Use   Vaping status: Never Used  Substance and Sexual Activity   Alcohol use: No   Drug use: No   Sexual activity: Yes    Birth control/protection: None  Other Topics Concern   Not on file  Social History Narrative   Not on file   Social Drivers of Health   Financial Resource Strain: Not on file  Food Insecurity: Not on file  Transportation Needs: Not on file  Physical Activity: Not on file  Stress: Not on file  Social  Connections: Unknown (03/17/2022)   Received from [facility]   Social Network    Social Network: Not on file     Family History: The patient's ***family history includes Alzheimer's disease in his mother; Diabetes in his brother, father, and mother; Hypertension in his brother, father, and mother; Stroke in his mother.  ROS:   Please see the history of present illness.    *** All other systems reviewed and are negative.  EKGs/Labs/Other Studies Reviewed:    The following studies were reviewed today: ***  EKG:  EKG is *** ordered today.  The ekg ordered today demonstrates ***  Recent Labs: 02/22/2024: TSH 0.820 05/27/2024: ALT 34; BUN 13; Creatinine, Ser 1.00; Hemoglobin 15.7; Platelets 191; Potassium 4.2; Sodium 138  Recent Lipid Panel    Component Value Date/Time   CHOL 129 05/27/2024 0941   TRIG 65 05/27/2024 0941   HDL 52 05/27/2024 0941   CHOLHDL 2.5 05/27/2024 0941   CHOLHDL 2.7 09/21/2015 0936   VLDL 11 09/21/2015 0936   LDLCALC 63 05/27/2024 0941    Physical Exam:    VS:  There were no vitals taken for this visit.    Wt Readings from Last 3 Encounters:  05/27/24 200 lb 12.8 oz (91.1 kg)  05/21/24 200 lb (90.7 kg)  02/22/24 201 lb (91.2  kg)     GEN: *** Well nourished, well developed in no acute distress HEENT: Normal NECK: No JVD; No carotid bruits LYMPHATICS: No lymphadenopathy CARDIAC: ***RRR, no murmurs, rubs, gallops RESPIRATORY:  Clear to auscultation without rales, wheezing or rhonchi  ABDOMEN: Soft, non-tender, non-distended MUSCULOSKELETAL:  No edema; No deformity  SKIN: Warm and dry NEUROLOGIC:  Alert and oriented x 3 PSYCHIATRIC:  Normal affect   ASSESSMENT:    No diagnosis found. PLAN:    CAD: Underwent cath in 2010 which showed severe ostial LAD disease with anatomy and favorable for PCI.  Underwent CABG with LIMA-LAD on 10/08/09.  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  ETT 07/2024 showed excellent exercise capacity (12.0 METS), no evidence of ischemia. -  Continue aspirin  81 mg daily - Continue atorvastatin  20 mg daily - Continue metoprolol  25 mg twice daily  Hyperlipidemia: Continue atorvastatin  20 mg daily  Hypertension: On metoprolol  25 mg twice daily  RTC in***     Medication Adjustments/Labs and Tests Ordered: Current medicines are reviewed at length with the patient today.  Concerns regarding medicines are outlined above.  No orders of the defined types were placed in this encounter.  No orders of the defined types were placed in this encounter.   There are no Patient Instructions on file for this visit.   Signed, Lonni LITTIE Nanas, MD  08/06/2024 4:16 PM    [facility]

## 2024-08-07 ENCOUNTER — Encounter (HOSPITAL_BASED_OUTPATIENT_CLINIC_OR_DEPARTMENT_OTHER): Payer: Self-pay | Admitting: Cardiology

## 2024-08-07 ENCOUNTER — Encounter (HOSPITAL_BASED_OUTPATIENT_CLINIC_OR_DEPARTMENT_OTHER): Payer: Self-pay | Admitting: *Deleted

## 2024-08-07 ENCOUNTER — Ambulatory Visit (HOSPITAL_BASED_OUTPATIENT_CLINIC_OR_DEPARTMENT_OTHER): Admitting: Cardiology

## 2024-08-07 VITALS — BP 112/60 | HR 82 | Ht 70.0 in | Wt 204.8 lb

## 2024-08-07 DIAGNOSIS — I2510 Atherosclerotic heart disease of native coronary artery without angina pectoris: Secondary | ICD-10-CM

## 2024-08-07 DIAGNOSIS — E785 Hyperlipidemia, unspecified: Secondary | ICD-10-CM

## 2024-08-07 DIAGNOSIS — I10 Essential (primary) hypertension: Secondary | ICD-10-CM | POA: Diagnosis not present

## 2024-08-07 NOTE — Patient Instructions (Addendum)
 Medication Instructions:  No changes *If you need a refill on your cardiac medications before your next appointment, please call your pharmacy*  Lab Work: none If you have labs (blood work) drawn today and your tests are completely normal, you will receive your results only by: MyChart Message (if you have MyChart) OR A paper copy in the mail If you have any lab test that is abnormal or we need to change your treatment, we will call you to review the results.  Testing/Procedures: none  Follow-Up: At [facility], you and your health needs are our priority.  As part of our continuing mission to provide you with exceptional heart care, our providers are all part of one team.  This team includes your primary Cardiologist (physician) and Advanced Practice Providers or APPs (Physician Assistants and Nurse Practitioners) who all work together to provide you with the care you need, when you need it.  Your next appointment:   6 month(s)  Provider:   Lonni Nanas, MD

## 2024-09-30 ENCOUNTER — Encounter: Payer: Self-pay | Admitting: Internal Medicine

## 2024-09-30 ENCOUNTER — Ambulatory Visit: Payer: Self-pay | Admitting: Internal Medicine

## 2024-09-30 VITALS — BP 126/72 | HR 93 | Temp 97.6°F | Ht 70.0 in | Wt 203.6 lb

## 2024-09-30 DIAGNOSIS — Z23 Encounter for immunization: Secondary | ICD-10-CM

## 2024-09-30 DIAGNOSIS — I25708 Atherosclerosis of coronary artery bypass graft(s), unspecified, with other forms of angina pectoris: Secondary | ICD-10-CM

## 2024-09-30 DIAGNOSIS — E785 Hyperlipidemia, unspecified: Secondary | ICD-10-CM | POA: Diagnosis not present

## 2024-09-30 DIAGNOSIS — E1169 Type 2 diabetes mellitus with other specified complication: Secondary | ICD-10-CM | POA: Diagnosis not present

## 2024-09-30 DIAGNOSIS — I119 Hypertensive heart disease without heart failure: Secondary | ICD-10-CM

## 2024-09-30 MED ORDER — METOPROLOL TARTRATE 25 MG PO TABS: 25.0000 mg | ORAL_TABLET | Freq: Two times a day (BID) | ORAL | 3 refills | Status: AC

## 2024-09-30 NOTE — Assessment & Plan Note (Signed)
 Chronic, he has a remote h/o of CABG in 2010.  He is currently without anginal symptoms. He will continue with ASA 81mg , metoprolol  bid and atorvastatin  daily. - Cardiology input is appreciated.

## 2024-09-30 NOTE — Progress Notes (Signed)
 I,Victoria T Emmitt, CMA,acting as a neurosurgeon for Catheryn LOISE Slocumb, MD.,have documented all relevant documentation on the behalf of Catheryn LOISE Slocumb, MD,as directed by  Catheryn LOISE Slocumb, MD while in the presence of Catheryn LOISE Slocumb, MD.  Subjective:  Patient ID: Darin Pitts , male    DOB: 25-Oct-1956 , 68 y.o.   MRN: [id number]  Chief Complaint  Patient presents with   Diabetes    Patient presents today for diabetes and blood pressure follow-up . He reports compliance with medications. Denies headache, chest pain and sob.    Hypertension    HPI Discussed the use of AI scribe software for clinical note transcription with the patient, who gave verbal consent to proceed.  History of Present Illness Darin Pitts is a 68 year old male with diabetes and hypertension who presents for a routine follow-up visit.  He has experienced an improvement in blood sugar levels, now around 90 mg/dL, since discontinuing Kool-Aid. He feels well and is pleased with this change, despite missing the beverage.  He continues to take Rybelsus , Synjardy , aspirin , and atorvastatin . He has not been taking metoprolol  regularly due to a surplus, resulting in a six-month gap, but has resumed it after running out.  He maintains a regular exercise routine and has increased his water intake since stopping Kool-Aid. He wants to live to 68 years old.   Diabetes He presents for his follow-up diabetic visit. He has type 2 diabetes mellitus. His disease course has been improving. There are no hypoglycemic associated symptoms. There are no diabetic associated symptoms. Pertinent negatives for diabetes include no blurred vision, no polydipsia, no polyphagia and no polyuria. There are no hypoglycemic complications. Diabetic complications include heart disease. Risk factors for coronary artery disease include diabetes mellitus, dyslipidemia, hypertension and male sex. He is compliant with treatment most of the time. He is  following a diabetic diet. He participates in exercise three times a week. Eye exam is current.  Hypertension This is a chronic problem. The current episode started more than 1 year ago. The problem has been gradually improving since onset. The problem is controlled. Pertinent negatives include no blurred vision or palpitations. Risk factors for coronary artery disease include diabetes mellitus, dyslipidemia, obesity and male gender. Past treatments include beta blockers. The current treatment provides moderate improvement. Hypertensive end-organ damage includes CAD/MI.     Past Medical History:  Diagnosis Date   CAD (coronary artery disease) 2010   CABG x 1  DR. OWEN   Hyperglycemia      Family History  Problem Relation Age of Onset   Hypertension Father    Diabetes Father    Hypertension Mother    Stroke Mother    Diabetes Mother    Alzheimer's disease Mother    Hypertension Brother    Diabetes Brother      Current Outpatient Medications:    aspirin  EC 81 MG tablet, Take 1 tablet (81 mg total) by mouth daily., Disp: , Rfl:    atorvastatin  (LIPITOR) 20 MG tablet, TAKE 1 TABLET BY MOUTH DAILY AT 6 PM., Disp: 90 tablet, Rfl: 2   Lancets Ultra Thin 30G MISC, USE TO CHECK BLOOD SUGAR ONCE  DAILY AS DIRECTED, Disp: 100 each, Rfl: 3   Multiple Vitamin (MULTIVITAMIN) tablet, Take 1 tablet by mouth daily., Disp: , Rfl:    ONETOUCH ULTRA test strip, CHECK BLOOD SUGAR DAILY, Disp: 100 strip, Rfl: 3   Semaglutide  (RYBELSUS ) 3 MG TABS, TAKE 1 TABLET DAILY., Disp: 90  tablet, Rfl: 1   SYNJARDY  XR 25-1000 MG TB24, TAKE 1 TABLET BY MOUTH EVERY DAY AT 6AM, Disp: 90 tablet, Rfl: 1   tadalafil (CIALIS) 20 MG tablet, Take 20 mg by mouth daily as needed., Disp: , Rfl:    metoprolol  tartrate (LOPRESSOR ) 25 MG tablet, Take 1 tablet (25 mg total) by mouth 2 (two) times daily., Disp: 180 tablet, Rfl: 3   No Known Allergies   Review of Systems  Constitutional: Negative.   Eyes:  Negative for blurred  vision.  Respiratory: Negative.    Cardiovascular: Negative.  Negative for palpitations.  Gastrointestinal: Negative.   Endocrine: Negative for polydipsia, polyphagia and polyuria.  Neurological: Negative.   Psychiatric/Behavioral: Negative.       Today's Vitals   09/30/24 1041  BP: 126/72  Pulse: 93  Temp: 97.6 F (36.4 C)  SpO2: 98%  Weight: 203 lb 9.6 oz (92.4 kg)  Height: 5' 10 (1.778 m)   Body mass index is 29.21 kg/m.  Wt Readings from Last 3 Encounters:  09/30/24 203 lb 9.6 oz (92.4 kg)  08/07/24 204 lb 12.8 oz (92.9 kg)  05/27/24 200 lb 12.8 oz (91.1 kg)     Objective:  Physical Exam Vitals and nursing note reviewed.  Constitutional:      Appearance: Normal appearance.  HENT:     Head: Normocephalic and atraumatic.  Eyes:     Extraocular Movements: Extraocular movements intact.  Cardiovascular:     Rate and Rhythm: Normal rate and regular rhythm.     Heart sounds: Normal heart sounds.  Pulmonary:     Effort: Pulmonary effort is normal.     Breath sounds: Normal breath sounds.  Musculoskeletal:     Cervical back: Normal range of motion.  Skin:    General: Skin is warm.  Neurological:     General: No focal deficit present.     Mental Status: He is alert.  Psychiatric:        Mood and Affect: Mood normal.      Assessment And Plan:  Type 2 diabetes mellitus with hyperlipidemia (HCC) Assessment & Plan: Chronic.  Blood glucose levels range 88-197 mg/dL. Discussed impact of sugary and diet beverages on diabetes management. - Continue with Rybelsus  and Synjardy  .   Orders: -     CMP14+EGFR -     Hemoglobin A1c  Benign hypertensive heart disease without heart failure Assessment & Plan: Chronic, well controlled. He will c/w metoprolol  tartrate 25mg  twice daily.  He is encouraged to follow a low sodium diet.  - We also discussed possibly adding an ARB in the future  Orders: -     CMP14+EGFR  Coronary artery disease of bypass graft of native heart  with stable angina pectoris Assessment & Plan: Chronic, he has a remote h/o of CABG in 2010.  He is currently without anginal symptoms. He will continue with ASA 81mg , metoprolol  bid and atorvastatin  daily. - Cardiology input is appreciated.   Immunization due -     Flu vaccine HIGH DOSE PF(Fluzone Trivalent)  Other orders -     Metoprolol  Tartrate; Take 1 tablet (25 mg total) by mouth 2 (two) times daily.  Dispense: 180 tablet; Refill: 3   Return for 4 month dm f/u. SABRA  Patient was given opportunity to ask questions. Patient verbalized understanding of the plan and was able to repeat key elements of the plan. All questions were answered to their satisfaction.   I, Catheryn LOISE Slocumb, MD, have reviewed all documentation  for this visit. The documentation on 09/30/24 for the exam, diagnosis, procedures, and orders are all accurate and complete.   IF YOU HAVE BEEN REFERRED TO A SPECIALIST, IT MAY TAKE 1-2 WEEKS TO SCHEDULE/PROCESS THE REFERRAL. IF YOU HAVE NOT HEARD FROM US /SPECIALIST IN TWO WEEKS, PLEASE GIVE US  A CALL AT [phone].   THE PATIENT IS ENCOURAGED TO PRACTICE SOCIAL DISTANCING DUE TO THE COVID-19 PANDEMIC.

## 2024-09-30 NOTE — Patient Instructions (Signed)

## 2024-09-30 NOTE — Assessment & Plan Note (Signed)
 Chronic.  Blood glucose levels range 88-197 mg/dL. Discussed impact of sugary and diet beverages on diabetes management. - Continue with Rybelsus  and Synjardy  .

## 2024-09-30 NOTE — Assessment & Plan Note (Signed)
 Chronic, well controlled. He will c/w metoprolol  tartrate 25mg  twice daily.  He is encouraged to follow a low sodium diet.  - We also discussed possibly adding an ARB in the future

## 2024-10-01 ENCOUNTER — Ambulatory Visit: Payer: Self-pay | Admitting: Internal Medicine

## 2024-10-01 LAB — CMP14+EGFR
ALT: 21 IU/L (ref 0–44)
AST: 24 IU/L (ref 0–40)
Albumin: 4.5 g/dL (ref 3.9–4.9)
Alkaline Phosphatase: 44 IU/L — ABNORMAL LOW (ref 47–123)
BUN/Creatinine Ratio: 12 (ref 10–24)
BUN: 12 mg/dL (ref 8–27)
Bilirubin Total: 0.5 mg/dL (ref 0.0–1.2)
CO2: 23 mmol/L (ref 20–29)
Calcium: 9.3 mg/dL (ref 8.6–10.2)
Chloride: 104 mmol/L (ref 96–106)
Creatinine, Ser: 0.99 mg/dL (ref 0.76–1.27)
Globulin, Total: 2.1 g/dL (ref 1.5–4.5)
Glucose: 116 mg/dL — ABNORMAL HIGH (ref 70–99)
Potassium: 4.2 mmol/L (ref 3.5–5.2)
Sodium: 140 mmol/L (ref 134–144)
Total Protein: 6.6 g/dL (ref 6.0–8.5)
eGFR: 83 mL/min/1.73 (ref >59)

## 2024-10-01 LAB — HEMOGLOBIN A1C
Est. average glucose Bld gHb Est-mCnc: 146 mg/dL
Hgb A1c MFr Bld: 6.7 % — ABNORMAL HIGH (ref 4.8–5.6)

## 2024-11-14 ENCOUNTER — Telehealth: Payer: Self-pay | Admitting: Cardiology

## 2024-11-14 NOTE — Telephone Encounter (Signed)
 Pt was calling in to ask if he needs another GXT done for DOT clearance for 2026.   Pt just had a GXT done on 07/23/24 and results were normal.  He saw Dr. Kate on 10/1 for follow-up and DOT clearance.   Advised the pt that he will not need another GXT at this time, but when he see's Dr. Kate in March of this year, he will more than likely order for him to get another GXT done for DOT clearance for this upcoming Sept 2026.   Pt verbalized understanding and agrees with this plan.  Pt was more than gracious for all the assistance provided.

## 2024-11-14 NOTE — Telephone Encounter (Signed)
 Pt calling to request stress test for his medical call to be cleared. Please advise.

## 2024-11-28 ENCOUNTER — Telehealth (HOSPITAL_BASED_OUTPATIENT_CLINIC_OR_DEPARTMENT_OTHER): Payer: Self-pay

## 2024-11-28 NOTE — Telephone Encounter (Signed)
" ° °  Name: Darin Pitts  DOB: 05-May-1956  MRN: [id number]  Primary Cardiologist: Lonni LITTIE Nanas, MD   Preoperative team, please contact this patient and set up a phone call appointment for further preoperative risk assessment. Please obtain consent and complete medication review. Thank you for your help.  I confirm that guidance regarding antiplatelet and oral anticoagulation therapy has been completed and, if necessary, noted below.  Ideally aspirin  should be continued without interruption, however if the bleeding risk is too great, aspirin  may be held for 5-7 days prior to surgery. Please resume aspirin  post operatively when it is felt to be safe from a bleeding standpoint.    I also confirmed the patient resides in the state of [place] . As per [facility] Medical Board telemedicine laws, the patient must reside in the state in which the provider is licensed.    Barnie Hila, NP 11/28/2024, 4:24 PM [facility]     "

## 2024-11-28 NOTE — Telephone Encounter (Signed)
"  ° °  Pre-operative Risk Assessment    Patient Name: Darin Pitts  DOB: Sep 26, 1956 MRN: [id number]   Date of last office visit: 08/07/2024 with Dr. Kate Date of next office visit: None  Request for Surgical Clearance    Procedure:  Colonoscopy  Date of Surgery:  Clearance 01/20/25                                 Surgeon:  Dr. Kristie Socks Group or Practice Name:  [facility]  Phone number:  [phone] Fax number:  [phone]   Type of Clearance Requested:   - Medical  - Pharmacy:  Hold Aspirin  does not specify   Type of Anesthesia:  propofol   Additional requests/questions:  None  SignedPatrcia Iverson CROME   11/28/2024, 12:30 PM   "

## 2024-11-28 NOTE — Telephone Encounter (Signed)
 Left message to call back to schedule tele pre op appt.

## 2024-12-03 NOTE — Telephone Encounter (Signed)
2nd attempt to reach pt to schedule tele pre op appt.

## 2024-12-04 ENCOUNTER — Telehealth (HOSPITAL_BASED_OUTPATIENT_CLINIC_OR_DEPARTMENT_OTHER): Payer: Self-pay | Admitting: *Deleted

## 2024-12-04 NOTE — Telephone Encounter (Signed)
 Pt has been scheduled tele preop appt 12/30/24. Med rec and consent are done.      Patient Consent for Virtual Visit        Darin Pitts has provided verbal consent on 12/04/2024 for a virtual visit (video or telephone).   CONSENT FOR VIRTUAL VISIT FOR:  Darin Pitts  By participating in this virtual visit I agree to the following:  I hereby voluntarily request, consent and authorize [facility] and its employed or contracted physicians, physician assistants, nurse practitioners or other licensed health care professionals (the Practitioner), to provide me with telemedicine health care services (the Services) as deemed necessary by the treating Practitioner. I acknowledge and consent to receive the Services by the Practitioner via telemedicine. I understand that the telemedicine visit will involve communicating with the Practitioner through live audiovisual communication technology and the disclosure of certain medical information by electronic transmission. I acknowledge that I have been given the opportunity to request an in-person assessment or other available alternative prior to the telemedicine visit and am voluntarily participating in the telemedicine visit.  I understand that I have the right to withhold or withdraw my consent to the use of telemedicine in the course of my care at any time, without affecting my right to future care or treatment, and that the Practitioner or I may terminate the telemedicine visit at any time. I understand that I have the right to inspect all information obtained and/or recorded in the course of the telemedicine visit and may receive copies of available information for a reasonable fee.  I understand that some of the potential risks of receiving the Services via telemedicine include:  Delay or interruption in medical evaluation due to technological equipment failure or disruption; Information transmitted may not be sufficient (e.g. poor  resolution of images) to allow for appropriate medical decision making by the Practitioner; and/or  In rare instances, security protocols could fail, causing a breach of personal health information.  Furthermore, I acknowledge that it is my responsibility to provide information about my medical history, conditions and care that is complete and accurate to the best of my ability. I acknowledge that Practitioner's advice, recommendations, and/or decision may be based on factors not within their control, such as incomplete or inaccurate data provided by me or distortions of diagnostic images or specimens that may result from electronic transmissions. I understand that the practice of medicine is not an exact science and that Practitioner makes no warranties or guarantees regarding treatment outcomes. I acknowledge that a copy of this consent can be made available to me via my patient portal [facility] MyChart), or I can request a printed copy by calling the office of [facility].    I understand that my insurance will be billed for this visit.   I have read or had this consent read to me. I understand the contents of this consent, which adequately explains the benefits and risks of the Services being provided via telemedicine.  I have been provided ample opportunity to ask questions regarding this consent and the Services and have had my questions answered to my satisfaction. I give my informed consent for the services to be provided through the use of telemedicine in my medical care

## 2024-12-04 NOTE — Telephone Encounter (Signed)
 Patient returned Pre-op call.

## 2024-12-04 NOTE — Telephone Encounter (Signed)
 Pt has been scheduled tele preop appt 12/30/24. Med rec and consent are done.

## 2024-12-30 ENCOUNTER — Ambulatory Visit

## 2025-01-14 ENCOUNTER — Ambulatory Visit: Admitting: Internal Medicine

## 2025-06-02 ENCOUNTER — Encounter: Payer: Self-pay | Admitting: Internal Medicine
